# Patient Record
Sex: Male | Born: 1985 | Race: White | Hispanic: No | Marital: Single | Smoking: Current every day smoker
Health system: Southern US, Community
[De-identification: ages and names within clinical notes are randomized; demographics above are authoritative.]

## PROBLEM LIST (undated history)

## (undated) DIAGNOSIS — I6330 Cerebral infarction due to thrombosis of unspecified cerebral artery: Secondary | ICD-10-CM

## (undated) DIAGNOSIS — R51 Headache: Secondary | ICD-10-CM

## (undated) DIAGNOSIS — S069XAA Unspecified intracranial injury with loss of consciousness status unknown, initial encounter: Secondary | ICD-10-CM

## (undated) DIAGNOSIS — I639 Cerebral infarction, unspecified: Secondary | ICD-10-CM

## (undated) DIAGNOSIS — S069X9A Unspecified intracranial injury with loss of consciousness of unspecified duration, initial encounter: Secondary | ICD-10-CM

## (undated) DIAGNOSIS — G8110 Spastic hemiplegia affecting unspecified side: Secondary | ICD-10-CM

## (undated) HISTORY — DX: Unspecified intracranial injury with loss of consciousness status unknown, initial encounter: S06.9XAA

## (undated) HISTORY — PX: ANKLE SURGERY: SHX546

## (undated) HISTORY — PX: EYE SURGERY: SHX253

## (undated) HISTORY — PX: TONSILECTOMY, ADENOIDECTOMY, BILATERAL MYRINGOTOMY AND TUBES: SHX2538

## (undated) HISTORY — DX: Headache: R51

## (undated) HISTORY — DX: Cerebral infarction, unspecified: I63.9

## (undated) HISTORY — DX: Spastic hemiplegia affecting unspecified side: G81.10

## (undated) HISTORY — DX: Unspecified intracranial injury with loss of consciousness of unspecified duration, initial encounter: S06.9X9A

## (undated) HISTORY — DX: Cerebral infarction due to thrombosis of unspecified cerebral artery: I63.30

---

## 2010-10-01 ENCOUNTER — Inpatient Hospital Stay (HOSPITAL_COMMUNITY)
Admission: RE | Admit: 2010-10-01 | Discharge: 2010-10-19 | DRG: 945 | Disposition: A | Discharge status: Home Health | Payer: Medicaid Other | Source: Other Acute Inpatient Hospital | Attending: Physical Medicine & Rehabilitation | Admitting: Physical Medicine & Rehabilitation

## 2010-10-01 DIAGNOSIS — Z5189 Encounter for other specified aftercare: Principal | ICD-10-CM

## 2010-10-01 DIAGNOSIS — R51 Headache: Secondary | ICD-10-CM

## 2010-10-01 DIAGNOSIS — I6330 Cerebral infarction due to thrombosis of unspecified cerebral artery: Secondary | ICD-10-CM

## 2010-10-01 DIAGNOSIS — G009 Bacterial meningitis, unspecified: Secondary | ICD-10-CM | POA: Diagnosis present

## 2010-10-01 DIAGNOSIS — F952 Tourette's disorder: Secondary | ICD-10-CM

## 2010-10-01 DIAGNOSIS — J189 Pneumonia, unspecified organism: Secondary | ICD-10-CM | POA: Diagnosis present

## 2010-10-01 DIAGNOSIS — G8190 Hemiplegia, unspecified affecting unspecified side: Secondary | ICD-10-CM | POA: Diagnosis present

## 2010-10-01 DIAGNOSIS — R4701 Aphasia: Secondary | ICD-10-CM | POA: Diagnosis present

## 2010-10-01 DIAGNOSIS — F909 Attention-deficit hyperactivity disorder, unspecified type: Secondary | ICD-10-CM | POA: Diagnosis present

## 2010-10-01 DIAGNOSIS — F17200 Nicotine dependence, unspecified, uncomplicated: Secondary | ICD-10-CM | POA: Diagnosis present

## 2010-10-01 DIAGNOSIS — I6340 Cerebral infarction due to embolism of unspecified cerebral artery: Secondary | ICD-10-CM | POA: Diagnosis present

## 2010-10-02 DIAGNOSIS — I6330 Cerebral infarction due to thrombosis of unspecified cerebral artery: Secondary | ICD-10-CM

## 2010-10-02 DIAGNOSIS — G009 Bacterial meningitis, unspecified: Secondary | ICD-10-CM

## 2010-10-02 DIAGNOSIS — F952 Tourette's disorder: Secondary | ICD-10-CM

## 2010-10-02 DIAGNOSIS — R51 Headache: Secondary | ICD-10-CM

## 2010-10-03 LAB — BASIC METABOLIC PANEL
CO2: 31 mEq/L (ref 19–32)
Calcium: 9.8 mg/dL (ref 8.4–10.5)
Creatinine, Ser: 0.76 mg/dL (ref 0.4–1.5)
GFR calc Af Amer: >60 mL/min (ref >60)
GFR calc non Af Amer: >60 mL/min (ref >60)
Glucose, Bld: 72 mg/dL (ref 70–99)
Sodium: 137 mEq/L (ref 135–145)

## 2010-10-04 ENCOUNTER — Inpatient Hospital Stay (HOSPITAL_COMMUNITY): Payer: Medicaid Other

## 2010-10-04 DIAGNOSIS — G009 Bacterial meningitis, unspecified: Secondary | ICD-10-CM

## 2010-10-04 DIAGNOSIS — F952 Tourette's disorder: Secondary | ICD-10-CM

## 2010-10-04 DIAGNOSIS — R51 Headache: Secondary | ICD-10-CM

## 2010-10-04 DIAGNOSIS — I6330 Cerebral infarction due to thrombosis of unspecified cerebral artery: Secondary | ICD-10-CM

## 2010-10-04 LAB — COMPREHENSIVE METABOLIC PANEL
ALT: 33 U/L (ref 0–53)
AST: 22 U/L (ref 0–37)
Albumin: 3.3 g/dL — ABNORMAL LOW (ref 3.5–5.2)
Calcium: 9.7 mg/dL (ref 8.4–10.5)
Creatinine, Ser: 0.78 mg/dL (ref 0.4–1.5)
GFR calc Af Amer: >60 mL/min (ref >60)
GFR calc non Af Amer: >60 mL/min (ref >60)
Sodium: 137 mEq/L (ref 135–145)
Total Protein: 7.3 g/dL (ref 6.0–8.3)

## 2010-10-04 LAB — CBC
MCH: 32 pg (ref 26.0–34.0)
MCHC: 33.4 g/dL (ref 30.0–36.0)
Platelets: 561 10*3/uL — ABNORMAL HIGH (ref 150–400)
RDW: 13.4 % (ref 11.5–15.5)

## 2010-10-04 LAB — DIFFERENTIAL
Basophils Absolute: 0.2 10*3/uL — ABNORMAL HIGH (ref 0.0–0.1)
Basophils Relative: 3 % — ABNORMAL HIGH (ref 0–1)
Eosinophils Absolute: 0.4 10*3/uL (ref 0.0–0.7)
Monocytes Absolute: 0.7 10*3/uL (ref 0.1–1.0)
Monocytes Relative: 14 % — ABNORMAL HIGH (ref 3–12)
Neutro Abs: 2 10*3/uL (ref 1.7–7.7)
Neutrophils Relative %: 43 % (ref 43–77)

## 2010-10-04 MED ORDER — IOHEXOL 350 MG/ML SOLN
50.0000 mL | Freq: Once | INTRAVENOUS | Status: AC | PRN
  Administered 2010-10-04: 50 mL via INTRAVENOUS

## 2010-10-05 LAB — LIPID PANEL
Cholesterol: 193 mg/dL (ref 0–200)
HDL: 57 mg/dL (ref >39)
LDL Cholesterol: 119 mg/dL — ABNORMAL HIGH (ref 0–99)
Triglycerides: 84 mg/dL (ref <150)

## 2010-10-05 LAB — PROTEIN S, TOTAL: Protein S Ag, Total: 130 % (ref 60–150)

## 2010-10-05 LAB — LUPUS ANTICOAGULANT PANEL
DRVVT: 42.3 secs (ref 36.2–44.3)
PTT Lupus Anticoagulant: 35.1 secs (ref 30.0–45.6)

## 2010-10-05 LAB — PROTEIN C ACTIVITY: Protein C Activity: 152 % — ABNORMAL HIGH (ref 75–133)

## 2010-10-05 LAB — HEMOGLOBIN A1C: Mean Plasma Glucose: 111 mg/dL (ref <117)

## 2010-10-06 ENCOUNTER — Ambulatory Visit (HOSPITAL_COMMUNITY)
Admission: RE | Admit: 2010-10-06 | Discharge: 2010-10-06 | Disposition: A | Discharge status: Home / Self Care | Payer: Medicaid Other | Source: Ambulatory Visit | Attending: Cardiology | Admitting: Cardiology

## 2010-10-06 DIAGNOSIS — Z0389 Encounter for observation for other suspected diseases and conditions ruled out: Secondary | ICD-10-CM | POA: Insufficient documentation

## 2010-10-06 DIAGNOSIS — Z5309 Procedure and treatment not carried out because of other contraindication: Secondary | ICD-10-CM | POA: Insufficient documentation

## 2010-10-06 LAB — CARDIOLIPIN ANTIBODIES, IGG, IGM, IGA
Anticardiolipin IgG: 5 GPL U/mL — ABNORMAL LOW (ref <23)
Anticardiolipin IgM: 5 MPL U/mL — ABNORMAL LOW (ref <11)

## 2010-10-06 LAB — BETA-2-GLYCOPROTEIN I ABS, IGG/M/A
Beta-2-Glycoprotein I IgA: 7 A Units (ref <20)
Beta-2-Glycoprotein I IgM: 5 M Units (ref <20)

## 2010-10-06 NOTE — Consult Note (Signed)
[name], [name] NO.:  [id number]  MEDICAL RECORD NO.:  [id number]           PATIENT TYPE:  I  LOCATION:  4025                         FACILITY:  [facility]  PHYSICIAN:  Mike Heritage, MD       DATE OF BIRTH:  Apr 19, 1986  DATE OF CONSULTATION:  10/04/2010 DATE OF DISCHARGE:                                CONSULTATION   REASON FOR CONSULTATION:  Subacute left basal ganglia stroke.  HISTORY OF PRESENT ILLNESS:  This is a 25 year old male with past medical history of headaches, cervicalgia, muscle spasms, pneumonia, Tourette syndrome, meningitis, new left basal ganglia stroke and ADHD. The patient was recently seen in [facility] for headache for 5 days and mental status change.  At that time, he was diagnosed with bacterial meningitis.  He was started on Rocephin for a course of 14 days.  During hospitalization at [facility], the patient was noted to have right-sided weakness.  On Sep 19, 2010, the patient had a CT of brain which showed a subacute right internal capsule basal ganglia stroke.  The patient was started on aspirin at that time.  The patient was recently transferred to rehab on Oct 01, 2010, for further evaluation and management of right-sided weakness.  After arriving in rehab, it was noted that the patient did not get full stroke workup and Neuro was consulted to evaluate the patient and make recommendations. At the present time, the patient is alert and oriented.  He shows expressive aphasia as far as difficulty word-finding and/or substitution.  In addition, he also shows significant right upper and lower extremity flaccidity.  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  MEDICATIONS:  Aspirin, low-molecular-weight heparin, Lexapro, Duragesic patch, Neurontin, oxycodone, vancomycin,  Tylenol.  ALLERGIES:  AMOXICILLIN.  SOCIAL HISTORY:  The patient smokes approximately a pack a day.  Does not drink, does not do illicit drugs.  Lives with his parents at home.  REVIEW OF SYSTEMS:  Negative with exception above.  PHYSICAL EXAMINATION:  GENERAL:  The patient is alert and oriented. Carries out 2-3 step commands. NEUROLOGIC:  Pupils equal, round, reactive.  Conjugate gaze. Extraocular movements intact.  Visual fields are grossly intact.  Face shows a positive right facial droop.  Tongue is midline.  Uvula is midline.  The patient shows dysarthria along with expressive aphasia. Facial sensation is intact to pinprick, light touch.  The patient has no shoulder shrug on the right and head turn is decreased to the left. Coordination:  Finger-to-nose is smooth on the left, unable to do on the right.  Heel-to-shin is smooth on the left, unable to do on the right. Fine motor movements are within normal limits on his left, unable to move his hand on the right.  The patient's motor shows 5/5 strength in left upper and left lower extremity.  He shows 0/5 strength on the right upper extremity with inability to shrug his shoulder on the right.  He shows no grip on the right.  He shows a minimal trace contraction of  his right quad when trying to adduct or abduct his leg.  Otherwise, he shows 0/5 strength on his right quad, hamstrings, calf, and foot.  The patient shows 2/4 reflexes on his left biceps, triceps, left patella, left Achilles.  He has downgoing toes on the left.  He shows 3/4 deep tendon reflexes on his right biceps and triceps with a 4/4 with clonus right patella deep tendon reflex.  He shows 16+ beats of clonus on his right Achilles deep tendon reflex and upgoing toe on the right.  The patient shows positive drift on his right upper and lower extremity as he is unable to withstand gravity.  No drift on his left upper and lower extremity.  The patient states that his  sensation is intact to pinprick, light touch and vibration throughout and there is no difference between the right and left. PULMONARY:  Clear to auscultation. CARDIOVASCULAR:  S1-S2 is audible. NECK:  Negative bruits.  LABORATORY DATA:  His sodium is 137, potassium 4.2, chloride 100, CO2 is 30, BUN 16, creatinine 0.78, glucose 90.  White blood cell count is 4.8, platelets 561, hemoglobin 12.4, hematocrit 37.1.  CT of head shows a new acute subacute well-defined left basal ganglia internal capsule infarct.  ASSESSMENT:  This is a pleasant 25 year old Caucasian male with a left subacute well-defined basal ganglia internal capsule stroke of unknown etiology at this time.  We cannot rule out bacterial emboli secondary to sepsis nor can rule out small vessel disease or hypercoagulable state given the patient's age.  Our recommendations at this time are: 1. TEE to further evaluate for a possible cardiac     embolic source. 2. Hypercoagulable panel to look for hypercoagulable state possibility 3. CT angio of head and neck. 4. Continue aspirin. 5. Smoking cessation. 6. Fasting lipid HbA1c.  Dr. Hoy Carrillo agrees with the above-mentioned and has seen and evaluated the patient.  Stroke team will follow in the morning.     Mike Morn, PA-C  I have examined the patient and agree with above clinical findings. ______________________________ Mike Heritage, MD    DS/MEDQ  D:  10/04/2010  T:  10/04/2010  Job:  [id number]  Electronically Signed by Mike Morn PA-C on 10/06/2010 01:01:42 PM Electronically Signed by Mike Heritage MD on 10/06/2010 03:20:34 PM

## 2010-10-07 ENCOUNTER — Inpatient Hospital Stay (HOSPITAL_COMMUNITY): Payer: Medicaid Other

## 2010-10-07 LAB — FACTOR 5 LEIDEN

## 2010-10-11 DIAGNOSIS — F952 Tourette's disorder: Secondary | ICD-10-CM

## 2010-10-11 DIAGNOSIS — G009 Bacterial meningitis, unspecified: Secondary | ICD-10-CM

## 2010-10-11 DIAGNOSIS — I6330 Cerebral infarction due to thrombosis of unspecified cerebral artery: Secondary | ICD-10-CM

## 2010-10-11 LAB — CULTURE, BLOOD (ROUTINE X 2)
Culture  Setup Time: 201205222337
Culture: NO GROWTH

## 2010-10-13 DIAGNOSIS — I619 Nontraumatic intracerebral hemorrhage, unspecified: Secondary | ICD-10-CM

## 2010-10-13 DIAGNOSIS — F0789 Other personality and behavioral disorders due to known physiological condition: Secondary | ICD-10-CM

## 2010-10-15 DIAGNOSIS — I6330 Cerebral infarction due to thrombosis of unspecified cerebral artery: Secondary | ICD-10-CM

## 2010-10-15 DIAGNOSIS — R51 Headache: Secondary | ICD-10-CM

## 2010-10-15 DIAGNOSIS — F952 Tourette's disorder: Secondary | ICD-10-CM

## 2010-10-15 DIAGNOSIS — G009 Bacterial meningitis, unspecified: Secondary | ICD-10-CM

## 2010-10-16 DIAGNOSIS — I6330 Cerebral infarction due to thrombosis of unspecified cerebral artery: Secondary | ICD-10-CM

## 2010-10-16 DIAGNOSIS — F952 Tourette's disorder: Secondary | ICD-10-CM

## 2010-10-16 DIAGNOSIS — R51 Headache: Secondary | ICD-10-CM

## 2010-10-16 DIAGNOSIS — G009 Bacterial meningitis, unspecified: Secondary | ICD-10-CM

## 2010-11-08 NOTE — H&P (Signed)
[name], [name] NO.:  [id number]  MEDICAL RECORD NO.:  [id number]           PATIENT TYPE:  I  LOCATION:  4025                         FACILITY:  [facility]  PHYSICIAN:  Ranelle Oyster, M.D.DATE OF BIRTH:  09-18-1985  DATE OF ADMISSION:  10/01/2010 DATE OF DISCHARGE:                             HISTORY & PHYSICAL   CHIEF COMPLAINT:  Right-sided weakness, headache, cervicalgia.  The patient has no PCP.  HISTORY OF PRESENT ILLNESS:  This is a 25 year old white male with Tourette syndrome, admitted to [facility] with headaches for 5 days and mental status changes.  He was started on IV vancomycin, Zosyn, acyclovir for meningitis.  LP done showing evidence of bacterial meningitis and IV Rocephin was recommended for 2 weeks by ID.  He still has pneumonia with blood cultures positive for MRSA.  Levaquin and vancomycin were recommended through May 19.  On May 6, the patient developed right-sided weakness and CT was done showing a new acute/subacute infarct in the left basal ganglia internal capsule. Aspirin was added for stroke prophylaxis per neuro input.  I felt this was likely a septic infarct.  The patient with ongoing right lower extremity pain.  Dopplers were negative.  Knee x-rays were negative. The patient had difficulties with impulsivity and problems with awareness.  The rehab team evaluate the case and felt he could benefit from an inpatient rehab stay.  REVIEW OF SYSTEMS:  Notable for cervicalgia, headache, muscle spasm. Full 12-point reviews in the written H and P.  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  FAMILY HISTORY:  Positive for diabetes and depression.  SOCIAL HISTORY:  The patient is single.  Worked odd jobs.  Lives with his parents, one-level house with ramp to enter.  He smoked one-pack per day and drinks.  He has a very supportive family who will help him  at discharge.  ALLERGIES:  AMOXIL.  HOME MEDICATIONS:  None.  LABS:  Hemoglobin 10.8, white count 9.2, platelets 579.  Sodium 139, potassium 4.9, BUN 8, creatinine 0.67.  PHYSICAL EXAM:  GENERAL:  The patient is lying in bed, pleasant, with mild discomfort.  He complained of headache and left neck pain. HEENT:  Right pupil larger than the left.  Conjunctivae was normal. Ears and throat exam notable for intact dentition.  He had some pain in the right jaw with mouth opening. NECK:  Notable for pain along left sternocleidomastoid.  Pain was worse with neck rotation to the right and bending to the left. CHEST:  Clear to auscultation bilaterally without wheezes, rales, or rhonchi. HEART:  Regular rate and rhythm without murmurs, rubs, or gallops. ABDOMEN:  Soft, nontender.  Bowel sounds are positive. SKIN:  Generally intact without any signs of breakdown. NEUROLOGIC:  Cranial nerves II-XII notable for right facial weakness, right tongue deviation.  Speech is slightly dysarthric.  He denies sensation differences from right to left.  Reflexes are hyperactive, 3+, with sustained clonus at the right ankle noted.  Judgment was fair, although, he had poor insight awareness.  Some language is confused at times.  Memory was fair.  Mood was flat at best.  Right upper extremity showed trace movement of the shoulder and biceps, some of it was reflexive.  He had 0/5 strength in the right lower extremity.  POST ADMISSION PHYSICIAN EVALUATION: 1. Functional deficit secondary to bacterial meningitis with potentially embolic     left basal ganglia/internal capsular infarct, subsequent right     hemiparesis, aphasia, and dysphasia. 2. The patient is admitted to receive collaborative interdisciplinary     care between the physiatrist, rehab nursing staff, and therapy     team. 3. The patient's level of medical complexity which includes his     meningitis, pneumonia, and pain management, as well as  substantial     therapy needs in context of that medical necessity cannot be     provided at a lesser intensity of care. 4. The patient has experienced substantial functional loss from his     baseline.  Premorbidly, he had been independent.  Currently, he is     contact guard assist for transfers with ability to weightbear to     the right leg, needing substantial verbal cues for safety.  Right     knee had to be guard for stability.  He has mod assist dawn shoes.     He is working on core strength and balance, is reaching activities.     Judging by the patient's diagnosis, physical exam, and functional     history, he has potential for functional progress which result in     measurable gains while inpatient rehab.  His gains will be of     substantial and practical use upon discharge to home in     facilitating, mobility, and self-care. 5. The physiatrist provide 24-hour management of medical needs as well     as oversight of the therapy plans/treatment and provide guidance as     appropriate regarding the interaction of the two.  Medical problem     list and plan are below. 6. A 24-hour rehab nursing team will assist in manage the patient's     skin care needs as well as bowel and bladder function, safety     awareness, integration of therapy, concept techniques, nutrition,     etc. 7. PT will assess and treat for lower extremity strength, functional     mobility, safety, gait, adaptive techniques, neuromuscular     education, spasticity management with goals, modified independent     supervision perhaps occasional min assist. 8. OT assess and treat for upper extremity use, ADLs, adaptive     techniques, equipment, functional mobility, gait.  Patient     education, the patient's family safety education with goals overall     from modified independent to min assist. 9. Speech language pathology will assess and treat for communication,     swallowing, and cognitive needs with goals,  ranging from modified     independent to min assist. 10.Case management with social worker will assess treat for     psychosocial issues and discharge planning. 11.Team conference will be held weekly to assess progress towards     goals and to determine barriers at discharge. 12.The patient has demonstrated sufficient medical stability and     exercise capacity to tolerate at least 3 hours of therapy per day     at least 5 days per week. 13.Estimated length of stay is approximately 3+ weeks.  Prognosis is     good.  MEDICAL PROBLEM LIST AND PLAN: 1. DVT prophylaxis with  subcu Lovenox.  We will follow for any signs     and symptoms of bleeding, complications.  Check platelets etc.     Stroke prophylaxis currently with aspirin per Neurology input.  I     think we need to further workup the stroke starting with an MRI/MRA on     admission.  I think the patient also needs echocardiogram     considering his bacteremia and the potential for an endocarditis. 2. Pain management, p.r.n. oxycodone for now.  We will observe     tolerance with activities and we want to ensure he is comfortable     and is able to sleep at night. 3. Mood:  Lexapro has been added to help boost mood.  Team will     provide ego support and further address as needed.  Consider     neuropsychology evaluation also. 4. Pneumonia:  Levaquin completes today.  Follow-up chest x-ray in the     morning.  The patient denies any coughing or any respiratory     symptoms on examination today. 5. MRSA bacteremia:  IV vancomycin times 21 days.  Follow renal status     levels as well as any other side effects including diarrhea. 6. Tourette syndrome:  We will observe for behavior and interaction     with staff.  Follow for attention.  The patient does show some     apraxia on examination. 7. Headache:  Continue to schedule ibuprofen, oxycodone.  Add heating     pads to left neck region as well to treat sternocleidomastoid  pain.     Ranelle Oyster, M.D.     ZTS/MEDQ  D:  10/01/2010  T:  10/02/2010  Job:  [id number]  Electronically Signed by Faith Rogue M.D. on 11/08/2010 11:41:04 AM

## 2010-11-09 NOTE — Discharge Summary (Signed)
Mike, Carrillo NO.:  [id number]  MEDICAL RECORD NO.:  [id number]  LOCATION:  4025                         FACILITY:  [facility]  PHYSICIAN:  [name], M.D.DATE OF BIRTH:  June 14, 1985  DATE OF ADMISSION:  10/01/2010 DATE OF DISCHARGE:  10/19/2010                              DISCHARGE SUMMARY   DISCHARGE DIAGNOSES: 1. 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 2. Headaches much improved. 3. Spasticity. 4. History of Tourette syndrome. 5. Methicillin-resistant Staphylococcus aureus bacteremia treated.  HISTORY OF PRESENT ILLNESS:  Mr. [name] is a 25 year old male with history of Tourette syndrome admitted to [facility] Sep 17, 2010, with headaches times 5 days and decreased level of consciousness and mental status changes.  The patient was started on IV vancomycin, Zosyn and acyclovir for meningitis.  LP done showed evidence of bacterial meningitis.  Recommendations are to treat with IV Rocephin x2 weeks per Infectious Disease.  The patient also developed pneumonia as well as bacteremia with blood cultures positive for MRSA.  He is being treated with Levaquin and vancomycin for this.  On Sep 19, 2010, the patient developed right-sided weakness.  CT of the head done showed new acute subacute left basal ganglia internal capsule infarct.  Aspirin was added for CVA prophylaxis per neuro input.  They felt this was likely septic infarct.  The patient has had issues with right lower extremity pain and right lower extremity Dopplers done have been negative for DVT.  X-rays of right knee have shown no abnormality.  Therapies were initiated and the patient is noted to have impulsivity with decreased awareness of deficits and word-finding deficits.  He requires encouragement to sit out of bed.  He is noted to have increased tone in right lower extremity.  PAST MEDICAL HISTORY:  Significant for: 1. 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 2. ADHD. 3. Tourette syndrome. 4. Asthma.  ALLERGIES:  AMOXICILLIN.  REVIEW OF SYMPTOMS:  Positive for weakness right side as well as headaches.  FAMILY HISTORY:  Positive for diabetes mellitus and depression.  SOCIAL HISTORY:  The patient is single and used to work odd jobs.  Lives with parents in 1-level home with ramp at entry.  Has a history of one- pack per day tobacco use.  Positive alcohol use, supportive family, is to provide assistance past discharge.  FUNCTIONAL HISTORY:  The patient was independent prior to admission.  FUNCTIONAL STATUS:  The patient is contact guard assist transfers with increased ability to weightbear through right lower extremity.  He is requiring verbal cues for safety and right knee block for mobility.  He is mod assist to don shoes with aid with OT.  He is working on core strength, balance and reaching activities at the edge of bed.  PHYSICAL EXAMINATION:  GENERAL:  The patient is thin adult male, pleasant, in mild discomfort due to headache. HEENT:  The patient with anisocoria right pupil larger than left. Conjunctiva normal.  Ears, throat exam notable for intact dentition. The patient has some pain in the right jaw with mouth opening. NECK:  Notable for pain along the left sternocleidomastoid pain worse with neck rotation to right and bending to  the left. LUNGS:  Clear to auscultation bilaterally without wheezes, rales or rhonchi. HEART:  Regular rate and rhythm without murmurs or gallops or rubs. ABDOMEN:  Soft, nontender with positive bowel sounds. SKIN:  Intact without any signs of breakdown. NEURO:  Cranial nerves II-XII notable for right facial weakness with right tongue deviation.  Speech is slightly dysarthric but confused language.  The patient denied sensation right to left.  Reflexes are hyperactive 3+ with sustained clonus on right ankle.  Judgment is fair although he has poor insight and poor awareness.  Memory is fair.   Mood is flat in bed.  Right upper extremity shows trace movement at shoulder and biceps, some of it is reflexive.  He has 0/5 strength in right lower extremity.  HOSPITAL COURSE:  Mr. [name] was admitted to rehab on Oct 01, 2010, for inpatient therapies to consist of PT, OT and speech therapy at least 3-hour 5 days a week.  Past admission physiatrist, rehab RN, and therapy team have worked together to provide customized collaborative interdisciplinary care.  The patient was maintained on IV Rocephin through Oct 02, 2010, and IV vanc through Oct 07, 2010, to complete his antibiotic course.  He was started on pain management with use of oxycodone initially on p.r.n. basis as well as heating pad to left neck to help with headaches.  As the patient continued to have issues with sustained headaches, Neurontin was added and slowly titrated upwards. Fentanyl patch was also initiated to help with pain management issues. The patient continued to be distracted due to significant headache and Topamax was added and titrated to 75 mg p.o. b.i.d..  He is noted to have some sedation at the time of discharge, therefore, Neurontin decreased to 300 mg p.o. t.i.d.  Family was educated regarding monitoring for sedation and if pt continues to be sleepy;  they are to further decrease Neurontin to b.i.d. basis.  Overall, headaches have greatly improved.  He was noted to have increased spasticity on right lower greater than right upper extremity and baclofen was added to help with this symptomatology.  Speech Therapy has been following for diet tolerance and the patient was advanced to regular diet and his appetite is very good.  Neurology was consulted for input on the patient for full workup of stroke.  They recommended a TEE for full workup.  Also, CT head and neck were ordered as the patient unable to have MRI due to metal plate.  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  TEE was attempted by Dr. Gala Romney.  As he had difficulty passing probe and it was again  attempted with sedation in OR.  He  continued to have difficulty passing the scope and he recommended GI workup for questionable esophageal abnormality such as webbing or stenosis.  Dr. Arlyce Dice was contacted for input.  However, Neurology felt that pt's large MCA stroke, likely due to terminal LCA stenosis and spasms from bacterial meningitis.  He recommended adding statin and folic acid.  He also recommended outpatient followup TCD and CT angio in a few weeks.  C-spine x-rays were done and these have shown no stenosis or spurring.  The patient's blood pressures have been checked on b.i.d.  basis during this stay. These have ranged from 90s to 120s systolic, 50s 60s diastolic.  Heart rate stable in 70s range.  Labs done past admission include hemoglobin A1c which is normal at 5.5.  Lipid profile revealed cholesterol 193, triglycerides 84, LDL 119, HDL 57.  Hypocoagulable panel showed high homocystine levels at 16.4, and negative for factor V mutation, negative for lupus anticoagulant.  Check of lytes past admission revealed sodium 137, potassium 4.2, chloride 100, CO2 30, BUN 6, creatinine 0.78, glucose 90.  LFTs normal revealing total bili at 0.4, alkaline phos 72, SGOT 22, SGPT 33, total protein 7.3, albumin 3.3.  CBC done revealed hemoglobin 12.4, hematocrit 37.1, white count 4.8, platelets 561.  Blood cultures x2 were repeated on Oct 05, 2010, and this have shown no growth.  Rehab RN has been working with the patient on bowel and bladder program and the patient has been  continent with scheduled toileting.  During the patient's stay in rehab, weekly team conferences were held to monitor the patient's progress, set goals and discuss barriers to discharge.  At the time of admission, the patient was noted to have cognitive linguistic deficits with decreased problem solving, decreased awareness, poor recall, question language of confusion versus aphasic semantic type errors, dysarthria with articulation errors and dysphagia.  The patient's progress has been limited in part due to his poor participation.  He is currently max total assist for insight and awareness and regarding his physical and cognitive deficits which in turn have handed his progress.  He is currently mod assist for verbal expression and comprehension with mild verbal and questioning cues to indicate word finding and self monitor and correct for errors.  He is currently at max assist for recall, problem solving, attention and awareness.  Family education was done with parents and patient is to continue receiving further followup home health speech therapy past discharge.  Occupational Therapy has worked with the patient on self- care tasks with focus on right-sided movement.  The patient was impaired by his cognitive deficits as well as decreased balance, decreased sensation and his right hemiparesis.  The patient has made slow progress and is currently at close supervision for bed or wheelchair to toilet transfers with assistance for body positioning and min assist for dynamic balance.  He requires assistance to promote weight bearing through right lower extremity.  He is min assist for bathing, supervision for dressing.  Family education has been done with mother and father who understand the amount of assistance that is currently needed. 24-hour supervision assistance is recommended to maintain safety with self-care tasks.  Physical Therapy has been working with the patient on mobility.   At admission, the patient was limited by right hemiparesis, decreased endurance as well as headaches.  He was at +2 total assist, patient 25% with assist for right lower extremity stance and swing for 50 feet.  He was noted to have clonus right lower extremity which interfered his mobility as well as issues with decreased postural control.  Currently, the patient is at supervision level for bed mobility and transfers.  He requires min assist for dynamic standing balance with upper extremity support, +2 total assist for mobility with, the patient had 45% for ambulation of short distances.  Family education has been done with demonstration of return of assistance needed for transfers, wheelchair navigation on curb, car transfers as well as home exercise plan for right upper and right lower extremity strengthening. The patient to continue receiving further follow-up home  health PT, OT, ST by [facility] past discharge.  On October 19, 2010, the patient was discharged to home.  DISCHARGE MEDICATIONS: 1. Tylenol 325-650 mg p.o. q.4 h. p.r.n. pain. 2. Aspirin 325 mg p.o. per day. 3. Baclofen 10 mg p.o. q.i.d. 4. Celexa 40 mg p.o. per day. 5. Fentanyl patch 25 mcg an hour, change q. 72 hours, #2 boxes Rx'ed,     next change on October 22, 2010. 6. Neurontin 300 mg p.o. t.i.d.  Monitor for sleepiness and decrease     to b.i.d. if sleepiness persists. 7. OxyIR 5 mg 1-2 p.o. q.6 h. p.r.n. moderate-to-severe pain #90    Rx'ed. 8. Pravastatin 40 mg p.o. at bedtime. 9. Topamax 25 mg 3 pills p.o. b.i.d.  DIET:  Regular.  ACTIVITY LEVEL:  24-hour supervision and assistance.  No strenuous activity.  SPECIAL INSTRUCTIONS:  No alcohol, no smoking, no driving.  [facility] to provide PT, OT, speech therapy and RN.  FOLLOWUP:  The patient to follow up with Dr. Riley Kill November 24, 2010, at 10:30 a.m.  Follow up with Dr. Pearlean Brownie in 4-6 weeks for a TCD and input regarding further  workup.     Delle Reining, P.A.   ______________________________ [name], M.D.    PL/MEDQ  D:  10/20/2010  T:  10/21/2010  Job:  [id number]  cc:   Pramod P. Pearlean Brownie, MD Bevelyn Buckles. Bensimhon, MD  Electronically Signed by Osvaldo Shipper. on 10/21/2010 03:41:36 PM Electronically Signed by Faith Rogue M.D. on 11/09/2010 09:17:57 AM

## 2010-11-24 ENCOUNTER — Encounter: Payer: Medicaid Other | Attending: Physical Medicine & Rehabilitation | Admitting: Physical Medicine & Rehabilitation

## 2010-11-24 DIAGNOSIS — R51 Headache: Secondary | ICD-10-CM | POA: Insufficient documentation

## 2010-11-24 DIAGNOSIS — G8110 Spastic hemiplegia affecting unspecified side: Secondary | ICD-10-CM | POA: Insufficient documentation

## 2010-11-24 DIAGNOSIS — I6330 Cerebral infarction due to thrombosis of unspecified cerebral artery: Secondary | ICD-10-CM

## 2010-11-24 DIAGNOSIS — S069X9A Unspecified intracranial injury with loss of consciousness of unspecified duration, initial encounter: Secondary | ICD-10-CM

## 2010-11-24 DIAGNOSIS — F952 Tourette's disorder: Secondary | ICD-10-CM | POA: Insufficient documentation

## 2010-11-24 DIAGNOSIS — Z8673 Personal history of transient ischemic attack (TIA), and cerebral infarction without residual deficits: Secondary | ICD-10-CM | POA: Insufficient documentation
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
## 2010-12-29 ENCOUNTER — Encounter: Payer: Self-pay | Admitting: Physical Medicine & Rehabilitation

## 2011-02-02 ENCOUNTER — Encounter: Payer: Medicaid Other | Attending: Physical Medicine & Rehabilitation | Admitting: Physical Medicine & Rehabilitation

## 2011-02-02 DIAGNOSIS — R51 Headache: Secondary | ICD-10-CM

## 2011-02-02 DIAGNOSIS — S069X9A Unspecified intracranial injury with loss of consciousness of unspecified duration, initial encounter: Secondary | ICD-10-CM

## 2011-02-02 DIAGNOSIS — G8110 Spastic hemiplegia affecting unspecified side: Secondary | ICD-10-CM

## 2011-02-02 DIAGNOSIS — I6330 Cerebral infarction due to thrombosis of unspecified cerebral artery: Secondary | ICD-10-CM

## 2011-02-02 DIAGNOSIS — I69959 Hemiplegia and hemiparesis following unspecified cerebrovascular disease affecting unspecified side: Secondary | ICD-10-CM | POA: Insufficient documentation

## 2011-02-02 DIAGNOSIS — Z8661 Personal history of infections of the central nervous system: Secondary | ICD-10-CM | POA: Insufficient documentation

## 2011-02-02 NOTE — Assessment & Plan Note (Signed)
[name], birth date, February 05, 1986, is back regarding his stroke and meningitis.  Pain is a bit better, he is still having symptoms in the right arm and leg.  Sleep is an issue.  Mother states he is sleeping all the time.  Speaking into the situation, [name] is not going to bed until 3 or 4 in the morning and then often sleeps still 3 the next day.  His family is trying to push him to get him up, but he is not receptive at times.  His medication regimen gets off as a result and everything becomes dysfunctional.  He reports pain in the right arm and sometimes in the leg as well.  Family reports he is engaging in some activities around the home, but still is poor safety awareness as a whole and poor awareness overall.  He also reports some trouble emptying his bladder.  REVIEW OF SYSTEMS:  Notable for the above.  Full 12-point review is in the written health and history section of the chart.  SOCIAL HISTORY:  As noted above.  Lives with his mom and dad.  PHYSICAL EXAMINATION:  VITAL SIGNS:  Blood pressure is 122/69, pulse 67, respiratory rate 16, he is satting 100% on room air. GENERAL:  The patient is generally alert, irritable at times.  Headache seems to be better. MUSCULOSKELETAL:  He has ongoing tone in the right upper extremity at 2/4 throughout.  He has 1 to 2 out of 4 tone in the right lower extremity, particularly the hamstrings which are more of 2 to 2+. Reflexes are hyperactive at 3+ to 4 out of 5.  He has 1/5 to trace tone in the right lower extremity, remains in the 1 to 2 out of 5 motor score on the right lower extremity. NEUROLOGIC:  He has right central VII tongue deviation.  Speech is more intelligible.  He remains immature at times.  He is impulsive at times. HEART:  Regular. CHEST:  Clear. ABDOMEN:  Soft, nontender.  ASSESSMENT: 1. History of meningitis and left basal ganglia infarcts. 2. Headaches. 3. Right spastic hemiparesis. 4. History of Tourette  syndrome.  PLAN: 1. I told the patient that he needd to take some accountability here.     There is a longstanding pattern of impulsivity and poor decision     making long before I ever met this patient.  He needs to start     going to bed at a reasonable hour and taking his meds later.  I     discussed with his mother about moving his medications back to 10     p.m. at night including his Neurontin evening dose as well as     nortriptyline, Topamax, Celexa, etc.  He also needs be up out of     the bed by 11 o'clock at the latest. 2. I did refill his fentanyl patch 25 mcg #10.  Oxycodone did not need     to be refilled today. 3. We will increase his nortriptyline from 50 mg to 100 mg at bedtime     to help with sleep and pain at nighttime. 4. We will set up for Botox injections to the right biceps, FDP, FDS,     pectoralis major, minor, teres major and minor, perhaps hamstrings     as well depending on exam at followup. 5. We will keep baclofen as it is, I am not hesitant to add any     systemic spasticity meds due to his history  of sleepiness.     Ranelle Oyster, M.D. Electronically Signed    ZTS/MedQ D:  02/02/2011 13:46:43  T:  02/02/2011 21:12:03  Job #:  [id number]

## 2011-03-25 ENCOUNTER — Encounter: Payer: Medicaid Other | Attending: Physical Medicine & Rehabilitation | Admitting: Physical Medicine & Rehabilitation

## 2011-03-25 DIAGNOSIS — G8110 Spastic hemiplegia affecting unspecified side: Secondary | ICD-10-CM | POA: Insufficient documentation
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
## 2011-04-27 ENCOUNTER — Encounter: Payer: Medicaid Other | Attending: Physical Medicine & Rehabilitation | Admitting: Physical Medicine & Rehabilitation

## 2011-04-27 DIAGNOSIS — G8110 Spastic hemiplegia affecting unspecified side: Secondary | ICD-10-CM

## 2011-04-27 NOTE — Procedures (Signed)
NAMELOYD, [name]               ACCOUNT NO.:  [id number]  MEDICAL RECORD NO.:  [id number]           PATIENT TYPE:  O  LOCATION:  PRM                          FACILITY:  [facility]  PHYSICIAN:  Ranelle Oyster, M.D.DATE OF BIRTH:  May 16, 1986  DATE OF PROCEDURE: DATE OF DISCHARGE:                              OPERATIVE REPORT  PROCEDURE:  Phenol injection, diagnostic code 342.11, spastic hemiparesis dominant limb.  DESCRIPTION OF PROCEDURE:  After informed consent and preparation of skin with isopropyl alcohol, I utilize stimulator guidance to find the tibial nerve at the popliteal fossa.  Patient was laid on his left side with the affected leg, superior.  Titrating down from initially 15 mA to less than 1 mA, we localized the tibial nerve and adequate muscle response in the calf and tibial muscles.  Then after aspiration, we injected 5 mL of phenol and around the nerve.  The patient tolerated it well and experienced loosening of the associated musculature while in the office.  I also refilled patient's fentanyl patch which we will try to drop the 12 mcg in an attempt to eventually wean.  Refilled his oxycodone 5 mg, #90, 1 q.6 hours p.r.n.  We will send him to [facility] for left ankle foot orthosis as well as a resting right hand, restored doses.  The patient will see me back in about a month.  We will check urine drug screen at that point.     Ranelle Oyster, M.D. Electronically Signed    ZTS/MEDQ  D:  04/27/2011 11:38:37  T:  04/27/2011 20:39:01  Job:  [id number]  cc:   Renae Fickle Fax: [phone]

## 2011-05-31 ENCOUNTER — Encounter: Payer: Medicaid Other | Attending: Physical Medicine & Rehabilitation | Admitting: Physical Medicine & Rehabilitation

## 2011-05-31 DIAGNOSIS — S069X9A Unspecified intracranial injury with loss of consciousness of unspecified duration, initial encounter: Secondary | ICD-10-CM

## 2011-05-31 DIAGNOSIS — R51 Headache: Secondary | ICD-10-CM

## 2011-05-31 DIAGNOSIS — G8110 Spastic hemiplegia affecting unspecified side: Secondary | ICD-10-CM

## 2011-05-31 DIAGNOSIS — F952 Tourette's disorder: Secondary | ICD-10-CM | POA: Insufficient documentation

## 2011-05-31 DIAGNOSIS — Z8661 Personal history of infections of the central nervous system: Secondary | ICD-10-CM | POA: Insufficient documentation

## 2011-05-31 DIAGNOSIS — I69959 Hemiplegia and hemiparesis following unspecified cerebrovascular disease affecting unspecified side: Secondary | ICD-10-CM | POA: Insufficient documentation

## 2011-05-31 DIAGNOSIS — I6330 Cerebral infarction due to thrombosis of unspecified cerebral artery: Secondary | ICD-10-CM

## 2011-05-31 NOTE — Assessment & Plan Note (Signed)
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  We performed a phenol block to his right tibial nerve back about a month ago.  He states that the injection was helpful for a few weeks, but the spasticity is returning.  He feels the Botox lasted longer which we did in November.  His pain is about 8-10/10.  His family states he does still sit around at home a lot and sleep a lot.  He does have some depression.  Family is working on increasing activity and finding some other goals and objectives and hobbies for him.  REVIEW OF SYSTEMS:  Notable for the above.  He does report some weight gain.  Full 12-point review is in the written health and history section of the chart.  Instantly, he has not received his right ankle foot orthosis from [facility] yet.  SOCIAL HISTORY:  The patient is single, living with his family.  Brother and mother with him today.  PHYSICAL EXAMINATION:  VITAL SIGNS:  Blood pressure is 119/68, pulse is 80, respiratory rate is 16 and he is satting 98% on room air. GENERAL:  The patient is generally pleasant and alert.  He is quite cooperative actually today. EXTREMITIES:  Right upper extremity tone remains at 2+ to 3/4 at biceps and hand intrinsics as well as wrist flexors.  He has tightness in the right shoulder as well at 2/4.  Underlying strength is grossly 1-2/5. Right lower extremity strength is grossly 1+ to 2/5 as well with tightness noted still at the tibialis anterior, extensor hallucis longus, tibialis posterior and gastrocnemius muscles.  Sensation remains diminished on the right side to pinprick and light touch. SKIN:  A bit modeled right hand and right foot. HEART:  Regular. CHEST:  Clear. ABDOMEN:  Soft, nontender.  ASSESSMENT: 1. History of meningitis left basal ganglia infarcts. 2. Headaches. 3. Spastic right hemiparesis. 4. History of Tourette syndrome.  PLAN: 1. Continue working on stretching and range  of motion at home.  The     patient needs to be out of his bed as much as possible.  He needs     to find other goals of hobbies to occupy his time.  Increasing his     medication not the dose either here. 2. Refilled fentanyl patch 12 mcg 1 q.72 h. #10 and his oxycodone 5 mg     1 q.6 h. p.r.n. #90. 3. Set up for Botox injections 400 units to right tibialis anterior,     tibialis posterior, gastrocnemius and extensor hallucis longus. 4. I asked the patient to follow up with [facility] regarding bracing. 5. I will see him back pending scheduling of the above.     Ranelle Oyster, M.D. Electronically Signed    ZTS/MedQ D:  05/31/2011 12:58:47  T:  05/31/2011 45:40:98  Job #:  [id number]

## 2011-07-06 ENCOUNTER — Ambulatory Visit: Payer: Medicaid Other | Admitting: Physical Medicine & Rehabilitation

## 2011-07-06 ENCOUNTER — Telehealth: Payer: Self-pay | Admitting: Physical Medicine & Rehabilitation

## 2011-07-06 MED ORDER — OXYCODONE HCL 5 MG PO CAPS: 5.0000 mg | ORAL_CAPSULE | Freq: Four times a day (QID) | ORAL | Status: DC | PRN

## 2011-07-06 MED ORDER — FENTANYL 12 MCG/HR TD PT72: 1.0000 | MEDICATED_PATCH | TRANSDERMAL | Status: DC

## 2011-07-06 NOTE — Telephone Encounter (Signed)
If i hear that patient is continuing to drink and smoke, then all narcotics will be d'ced.

## 2011-07-06 NOTE — Telephone Encounter (Signed)
Mother requesting r/f on pain patches and oxycodone.  Topamax only has 1 refill.  Also states that  Osmin has started smoking, drinking and wanting to take only his pain pills.  Not eating the way he should because he thinks he is fat.  Does not know what to do.  Any suggestions?

## 2011-07-06 NOTE — Telephone Encounter (Signed)
Pt mother aware that rx are ready to pick up. She was told that if we are told anymore that Roey has been drinking or smoking we will no longer give him narcotics. She agrees with this.

## 2011-07-06 NOTE — Telephone Encounter (Signed)
Last seen on 05/31/11. Last fill on both meds was 05/31/11. Okay to order?  Any suggestions to give his mother about his smoking, drinking and not eating?  Thanks.

## 2011-07-27 ENCOUNTER — Other Ambulatory Visit: Payer: Self-pay | Admitting: *Deleted

## 2011-07-27 MED ORDER — BACLOFEN 20 MG PO TABS: 20.0000 mg | ORAL_TABLET | Freq: Four times a day (QID) | ORAL | Status: DC

## 2011-08-05 ENCOUNTER — Encounter: Payer: Medicaid Other | Admitting: Physical Medicine & Rehabilitation

## 2011-08-05 ENCOUNTER — Telehealth: Payer: Self-pay | Admitting: Physical Medicine & Rehabilitation

## 2011-08-05 NOTE — Telephone Encounter (Signed)
Patient needs refills on Fentanyl and Oxycodone.  [name] is sick today and mother cancelled Botox appointment for today.

## 2011-08-05 NOTE — Telephone Encounter (Signed)
Salar missed appt today because of illness. Requesting refills on Fentanyl 12 mcg 1 q72hr #10, and Oxycodone HCL IR 5 mg (capsule?) 1q6hr prn # 60,  last written 07/06/2011

## 2011-08-08 MED ORDER — OXYCODONE HCL 5 MG PO CAPS: 5.0000 mg | ORAL_CAPSULE | Freq: Four times a day (QID) | ORAL | Status: DC | PRN

## 2011-08-08 MED ORDER — FENTANYL 12 MCG/HR TD PT72: 1.0000 | MEDICATED_PATCH | TRANSDERMAL | Status: DC

## 2011-08-08 NOTE — Telephone Encounter (Signed)
i refilled 

## 2011-08-16 ENCOUNTER — Other Ambulatory Visit: Payer: Self-pay | Admitting: Physical Medicine & Rehabilitation

## 2011-09-06 ENCOUNTER — Telehealth: Payer: Self-pay

## 2011-09-06 MED ORDER — FENTANYL 12 MCG/HR TD PT72: 1.0000 | MEDICATED_PATCH | TRANSDERMAL | Status: DC

## 2011-09-06 MED ORDER — OXYCODONE HCL 5 MG PO CAPS: 5.0000 mg | ORAL_CAPSULE | Freq: Four times a day (QID) | ORAL | Status: DC | PRN

## 2011-09-06 NOTE — Telephone Encounter (Signed)
Pt mom aware that rx will be ready today or Thursday.

## 2011-09-06 NOTE — Telephone Encounter (Signed)
Pt called for refill request but did not specify what needed.

## 2011-09-14 ENCOUNTER — Other Ambulatory Visit: Payer: Self-pay | Admitting: Physical Medicine & Rehabilitation

## 2011-09-30 ENCOUNTER — Encounter: Payer: Medicaid Other | Attending: Physical Medicine & Rehabilitation | Admitting: Physical Medicine & Rehabilitation

## 2011-09-30 ENCOUNTER — Encounter: Payer: Self-pay | Admitting: Physical Medicine & Rehabilitation

## 2011-09-30 VITALS — BP 135/74 | HR 108 | Resp 14 | Ht 74.0 in | Wt 199.0 lb

## 2011-09-30 DIAGNOSIS — I69959 Hemiplegia and hemiparesis following unspecified cerebrovascular disease affecting unspecified side: Secondary | ICD-10-CM | POA: Insufficient documentation

## 2011-09-30 DIAGNOSIS — I6330 Cerebral infarction due to thrombosis of unspecified cerebral artery: Secondary | ICD-10-CM

## 2011-09-30 DIAGNOSIS — G8110 Spastic hemiplegia affecting unspecified side: Secondary | ICD-10-CM

## 2011-09-30 DIAGNOSIS — F952 Tourette's disorder: Secondary | ICD-10-CM

## 2011-09-30 MED ORDER — FENTANYL 12 MCG/HR TD PT72: 1.0000 | MEDICATED_PATCH | TRANSDERMAL | Status: DC

## 2011-09-30 MED ORDER — OXYCODONE HCL 5 MG PO CAPS: 5.0000 mg | ORAL_CAPSULE | Freq: Four times a day (QID) | ORAL | Status: DC | PRN

## 2011-09-30 NOTE — Patient Instructions (Signed)
You must stretch your arm and leg

## 2011-09-30 NOTE — Progress Notes (Signed)
  Subjective:    Patient ID: [name], male    DOB: Sep 15, 1985, 26 y.o.   MRN: [id number]  HPI  [name]'s back for botox injections today.   Pain Inventory Average Pain 10 Pain Right Now 8 My pain is sharp  In the last 24 hours, has pain interfered with the following? General activity 7 Relation with others 7 Enjoyment of life 9 What TIME of day is your pain at its worst? varies Sleep (in general) Poor  Pain is worse with: walking, bending, sitting, inactivity and standing Pain improves with: rest and therapy/exercise Relief from Meds: 3  Mobility walk with assistance use a cane how many minutes can you walk? as much as possible ability to climb steps?  yes do you drive?  no use a wheelchair transfers alone Do you have any goals in this area?  yes  Function disabled: date disabled  I need assistance with the following:  dressing, bathing, meal prep and household duties Do you have any goals in this area?  yes  Neuro/Psych No problems in this area  Prior Studies Any changes since last visit?  no  Physicians involved in your care Any changes since last visit?  no        Review of Systems  All other systems reviewed and are negative.       Objective:   Physical Exam        Assessment & Plan:  Botox Injection for spasticity using needle EMG guidance  Dilution: 100 Units/ml Indication: Severe spasticity which interferes with ADL,mobility and/or  hygiene and is unresponsive to medication management and other conservative care Informed consent was obtained after describing risks and benefits of the procedure with the patient. This includes bleeding, bruising, infection, excessive weakness, or medication side effects. A REMS form is on file and signed. Needle: 50mm 25g needle electrode Number of units per muscle Right side was injected Gastrocnemius 100u Tibialis posterior 100u EHL 75u Tibialis anterior 25u FCR/FDS/FDP/FCU 100u  All  injections were done after obtaining appropriate EMG activity and after negative drawback for blood. The patient tolerated the procedure well. Post procedure instructions were given. A followup appointment was made.    I also refilled his fentanyl and oxycodone. We re-emphasized stretching.   Will see him back in 3 months.

## 2011-10-11 ENCOUNTER — Other Ambulatory Visit: Payer: Self-pay | Admitting: Physical Medicine & Rehabilitation

## 2011-10-17 ENCOUNTER — Other Ambulatory Visit: Payer: Self-pay | Admitting: Physical Medicine & Rehabilitation

## 2011-10-31 ENCOUNTER — Other Ambulatory Visit: Payer: Self-pay | Admitting: Physical Medicine & Rehabilitation

## 2011-11-02 ENCOUNTER — Telehealth: Payer: Self-pay | Admitting: Physical Medicine & Rehabilitation

## 2011-11-02 DIAGNOSIS — I6330 Cerebral infarction due to thrombosis of unspecified cerebral artery: Secondary | ICD-10-CM

## 2011-11-02 DIAGNOSIS — G8110 Spastic hemiplegia affecting unspecified side: Secondary | ICD-10-CM

## 2011-11-02 MED ORDER — FENTANYL 12 MCG/HR TD PT72: 1.0000 | MEDICATED_PATCH | TRANSDERMAL | Status: DC

## 2011-11-02 MED ORDER — OXYCODONE HCL 5 MG PO CAPS: 5.0000 mg | ORAL_CAPSULE | Freq: Four times a day (QID) | ORAL | Status: DC | PRN

## 2011-11-02 NOTE — Telephone Encounter (Signed)
Pt mother aware that rx are ready for pickup and that we do not give 90 supply of narcs. She understood.

## 2011-11-02 NOTE — Telephone Encounter (Signed)
Refill on patches and Oxycodone.  Requesting 90 day supply.

## 2011-11-02 NOTE — Telephone Encounter (Signed)
Printed rx's for [name] to sign for 30 day supply. We do not give 90 day supply of narcotics.

## 2011-12-13 ENCOUNTER — Telehealth: Payer: Self-pay | Admitting: Physical Medicine & Rehabilitation

## 2011-12-13 DIAGNOSIS — I6330 Cerebral infarction due to thrombosis of unspecified cerebral artery: Secondary | ICD-10-CM

## 2011-12-13 DIAGNOSIS — G8110 Spastic hemiplegia affecting unspecified side: Secondary | ICD-10-CM

## 2011-12-13 MED ORDER — OXYCODONE HCL 5 MG PO CAPS: 5.0000 mg | ORAL_CAPSULE | Freq: Four times a day (QID) | ORAL | Status: DC | PRN

## 2011-12-13 MED ORDER — FENTANYL 12 MCG/HR TD PT72: 1.0000 | MEDICATED_PATCH | TRANSDERMAL | Status: DC

## 2011-12-13 NOTE — Telephone Encounter (Signed)
Refill on patches and Oxycodone.

## 2011-12-13 NOTE — Telephone Encounter (Signed)
Pt mother informed scripts are ready for pick up his father Rayna Sexton will pick up.

## 2011-12-13 NOTE — Telephone Encounter (Signed)
Script printed to be signed.

## 2011-12-14 ENCOUNTER — Telehealth: Payer: Self-pay | Admitting: Physical Medicine & Rehabilitation

## 2011-12-14 NOTE — Telephone Encounter (Signed)
Having problems getting meds filled.

## 2011-12-14 NOTE — Telephone Encounter (Signed)
Pt mother states that we have to call the pharmacy and get clarification on what needs to be done to rx filled, she was quite sure what needed to be done.  When I talked to a pharmacist she said that a prior auth was needed.

## 2011-12-15 ENCOUNTER — Telehealth: Payer: Self-pay | Admitting: Physical Medicine & Rehabilitation

## 2011-12-15 NOTE — Telephone Encounter (Signed)
Pt mother aware that we have done a prior auth.

## 2011-12-15 NOTE — Telephone Encounter (Signed)
Please call.  They are having issues getting his meds.

## 2011-12-15 NOTE — Telephone Encounter (Signed)
Issue getting meds filled

## 2011-12-20 ENCOUNTER — Encounter: Payer: Medicaid Other | Admitting: Physical Medicine & Rehabilitation

## 2011-12-22 ENCOUNTER — Telehealth: Payer: Self-pay | Admitting: Physical Medicine & Rehabilitation

## 2011-12-22 NOTE — Telephone Encounter (Signed)
We have done a prior authorization on his Fentanyl patches. Medicaid still hasn't made a decision. Is there something we can do something for him in the meantime? He is currently on Oxycodone and Fentanyl.

## 2011-12-22 NOTE — Telephone Encounter (Signed)
Pt mother aware.

## 2011-12-22 NOTE — Telephone Encounter (Signed)
He could increase his gabapentin, add 1 tablet at night, if not sufficient add another in the morning after 3 days, or we could add an antiinflammatory ,( has he tolerated a specific one in the past) until he hopefully gets his patches.

## 2011-12-22 NOTE — Telephone Encounter (Signed)
Made appt for 01/20/12.  Needs patches filled.

## 2012-01-09 ENCOUNTER — Telehealth: Payer: Self-pay | Admitting: Physical Medicine & Rehabilitation

## 2012-01-09 DIAGNOSIS — G8110 Spastic hemiplegia affecting unspecified side: Secondary | ICD-10-CM

## 2012-01-09 DIAGNOSIS — I6330 Cerebral infarction due to thrombosis of unspecified cerebral artery: Secondary | ICD-10-CM

## 2012-01-09 NOTE — Telephone Encounter (Signed)
[facility] out of Oxycodone.  Not taking any other pills other than the night pills, Gabapentin, Nortriptyline, Citalopram, Previstsin(?) and Topamax.  But sometimes he throws those away.

## 2012-01-09 NOTE — Telephone Encounter (Signed)
Do you want to refill his Oxycodone?

## 2012-01-10 MED ORDER — OXYCODONE HCL 5 MG PO CAPS: 5.0000 mg | ORAL_CAPSULE | Freq: Four times a day (QID) | ORAL | Status: DC | PRN

## 2012-01-10 NOTE — Telephone Encounter (Signed)
ok 

## 2012-01-11 NOTE — Telephone Encounter (Signed)
Pt mother aware that rx is ready to be picked up.

## 2012-01-17 ENCOUNTER — Other Ambulatory Visit: Payer: Self-pay | Admitting: Physical Medicine & Rehabilitation

## 2012-01-20 ENCOUNTER — Encounter: Payer: Self-pay | Admitting: Physical Medicine & Rehabilitation

## 2012-01-20 ENCOUNTER — Encounter: Payer: Medicaid Other | Attending: Physical Medicine & Rehabilitation | Admitting: Physical Medicine & Rehabilitation

## 2012-01-20 VITALS — BP 128/78 | HR 109 | Resp 14 | Ht 73.0 in | Wt 194.0 lb

## 2012-01-20 DIAGNOSIS — I69959 Hemiplegia and hemiparesis following unspecified cerebrovascular disease affecting unspecified side: Secondary | ICD-10-CM | POA: Insufficient documentation

## 2012-01-20 DIAGNOSIS — S069X9A Unspecified intracranial injury with loss of consciousness of unspecified duration, initial encounter: Secondary | ICD-10-CM

## 2012-01-20 DIAGNOSIS — Z87820 Personal history of traumatic brain injury: Secondary | ICD-10-CM | POA: Insufficient documentation

## 2012-01-20 DIAGNOSIS — R338 Other retention of urine: Secondary | ICD-10-CM | POA: Insufficient documentation

## 2012-01-20 DIAGNOSIS — G8110 Spastic hemiplegia affecting unspecified side: Secondary | ICD-10-CM

## 2012-01-20 DIAGNOSIS — N319 Neuromuscular dysfunction of bladder, unspecified: Secondary | ICD-10-CM | POA: Insufficient documentation

## 2012-01-20 DIAGNOSIS — Z5181 Encounter for therapeutic drug level monitoring: Secondary | ICD-10-CM

## 2012-01-20 DIAGNOSIS — R269 Unspecified abnormalities of gait and mobility: Secondary | ICD-10-CM | POA: Insufficient documentation

## 2012-01-20 DIAGNOSIS — M25569 Pain in unspecified knee: Secondary | ICD-10-CM | POA: Insufficient documentation

## 2012-01-20 DIAGNOSIS — S069X9S Unspecified intracranial injury with loss of consciousness of unspecified duration, sequela: Secondary | ICD-10-CM

## 2012-01-20 DIAGNOSIS — I6330 Cerebral infarction due to thrombosis of unspecified cerebral artery: Secondary | ICD-10-CM

## 2012-01-20 DIAGNOSIS — R4586 Emotional lability: Secondary | ICD-10-CM | POA: Insufficient documentation

## 2012-01-20 MED ORDER — OXYCODONE HCL 5 MG PO CAPS: 5.0000 mg | ORAL_CAPSULE | Freq: Four times a day (QID) | ORAL | Status: DC | PRN

## 2012-01-20 MED ORDER — TAMSULOSIN HCL 0.4 MG PO CAPS: 0.4000 mg | ORAL_CAPSULE | Freq: Every day | ORAL | Status: AC

## 2012-01-20 NOTE — Patient Instructions (Signed)
Continue stretching your arm and leg!

## 2012-01-20 NOTE — Progress Notes (Signed)
Subjective:    Patient ID: [name], male    DOB: April 26, 1986, 26 y.o.   MRN: [id number]  HPI  Derico is back regarding his spastic right hemiparesis. He had positive results from the botox injections. He's having difficulty urinating and typically only goes to the bathroom once a day. He does report lower abdominal distention. He may urinate up to 3 x per day.   He is walking frequently, taking his dogs out for walks. He cuts the grass also. He complains of increased right knee pain and the "current medication isn't enough"  Behavior has been somewhat improved.  Pain Inventory Average Pain 8 Pain Right Now 8 My pain is sharp, stabbing and aching  In the last 24 hours, has pain interfered with the following? General activity 7 Relation with others 6 Enjoyment of life 5 What TIME of day is your pain at its worst? all the time Sleep (in general) Poor  Pain is worse with: walking and some activites Pain improves with: medication Relief from Meds: 2  Mobility use a cane how many minutes can you walk? alot when not hurting ability to climb steps?  yes do you drive?  no needs help with transfers Do you have any goals in this area?  yes  Function not employed: date last employed 09/2010 disabled: date disabled 09/2010 I need assistance with the following:  dressing, bathing, meal prep and household duties Do you have any goals in this area?  yes  Neuro/Psych bladder control problems bowel control problems weakness numbness trouble walking spasms  Prior Studies Any changes since last visit?  no  Physicians involved in your care Any changes since last visit?  no   Family History  Problem Relation Age of Onset  . Arthritis Mother   . Anxiety disorder Mother   . Diabetes Father    History   Social History  . Marital Status: Single    Spouse Name: N/A    Number of Children: N/A  . Years of Education: N/A   Social History Main Topics  . Smoking status:  Current Everyday Smoker    Types: Cigarettes  . Smokeless tobacco: Current User  . Alcohol Use: Yes  . Drug Use: No  . Sexually Active: None   Other Topics Concern  . None   Social History Narrative  . None   Past Surgical History  Procedure Date  . Eye surgery   . Ankle surgery   . Tonsilectomy, adenoidectomy, bilateral myringotomy and tubes    Past Medical History  Diagnosis Date  . Cerebral thrombosis with cerebral infarction   . Spastic hemiplegia affecting dominant side   . TBI (traumatic brain injury)   . Headache   . Asthma   . Stroke    BP 128/78  Pulse 109  Resp 14  Ht 6\' 1"  (1.854 m)  Wt 194 lb (87.998 kg)  BMI 25.60 kg/m2  SpO2 100%     Review of Systems  Gastrointestinal: Positive for constipation.  Genitourinary: Positive for dysuria and difficulty urinating.  Musculoskeletal: Positive for myalgias, arthralgias and gait problem.  Neurological: Positive for weakness and numbness.  All other systems reviewed and are negative.       Objective:   Physical Exam  General: Alert and oriented x 3, No apparent distress HEENT: Head is normocephalic, atraumatic, PERRLA, EOMI, sclera anicteric, oral mucosa pink and moist, dentition intact, ext ear canals clear,  Neck: Supple without JVD or lymphadenopathy Heart: Reg rate and rhythm. No  murmurs rubs or gallops Chest: CTA bilaterally without wheezes, rales, or rhonchi; no distress Abdomen: Soft, non-tender, non-distended, bowel sounds positive. Extremities: No clubbing, cyanosis, or edema. Pulses are 2+ Skin: Clean and intact without signs of breakdown Neuro: Pt is cognitively appropriate with normal insight, memory, and awareness. A little less impulsive today. Right UE 2/5 with tone 2-3 at wrist and FF's. RLE 2-3 with weakness distally. Tone is 2/4. Was able to move foot to neutral. Musculoskeletal: Full ROM, No pain with AROM or PROM in the neck, trunk. Left knee with minimal pain upon ROM. Posture  appropriate. He bears more weight on the left leg with gait.  Psych: Pt's affect is appropriate. Pt is cooperative         Assessment & Plan:  1. Left CVA with spastic right hemiparesis 2. Hx of TBI, behavioral lability 3. Urinary retention/neurogenic 4. Left knee pain, due to overuse, gait pattern  Plan: 1.Will check renal ultrasound, add flomax. Consider urology consult 2.Will set up for botox WF, FDP, FDS 400 units, consider further gastroc injections also 3. Refill pain meds today, UDS was collected. 4. Follow up in one month for injections 5. Recommended OTC naproxen 1-2 tabs q12 hours prn for left knee. He also needs to work on better technique and try not to "over do" things.

## 2012-01-24 ENCOUNTER — Telehealth: Payer: Self-pay

## 2012-01-24 NOTE — Telephone Encounter (Signed)
Lm advising patient that in order for him to get his oxycodone script he would need to leave give a urine sample.  Patient was aware of this the day he was in the office.

## 2012-02-02 ENCOUNTER — Other Ambulatory Visit (HOSPITAL_COMMUNITY): Payer: Medicaid Other

## 2012-02-13 ENCOUNTER — Ambulatory Visit: Payer: Medicaid Other | Admitting: Physical Medicine & Rehabilitation

## 2012-02-13 ENCOUNTER — Other Ambulatory Visit: Payer: Self-pay | Admitting: Physical Medicine & Rehabilitation

## 2012-04-18 ENCOUNTER — Other Ambulatory Visit: Payer: Self-pay | Admitting: *Deleted

## 2012-04-18 MED ORDER — TOPIRAMATE 100 MG PO TABS: ORAL_TABLET | ORAL | Status: DC

## 2012-05-17 ENCOUNTER — Other Ambulatory Visit: Payer: Self-pay | Admitting: Physical Medicine & Rehabilitation

## 2012-07-14 ENCOUNTER — Other Ambulatory Visit: Payer: Self-pay | Admitting: Physical Medicine & Rehabilitation

## 2012-07-16 ENCOUNTER — Telehealth: Payer: Self-pay

## 2012-07-16 NOTE — Telephone Encounter (Signed)
Erroneous encounter

## 2012-09-02 IMAGING — CT CT ANGIO HEAD
1 of 11 series · 4 of 33 positions shown · IV contrast (omnipaque)
Comparison: CT of the head without contrast at [facility]
09/19/2010 and 09/17/2010.

CTA NECK

CLINICAL DATA: Right-sided paralysis.  Left sided stroke.

CT ANGIOGRAPHY HEAD AND NECK
TECHNIQUE: Multidetector CT imaging of the head and neck was
performed using the standard protocol during bolus administration
of intravenous contrast.  Multiplanar CT image reconstructions
including MIPs were obtained to evaluate the vascular anatomy.
Carotid stenosis measurements (when applicable) are obtained
utilizing NASCET criteria, using the distal internal carotid
diameter as the denominator.
Contrast:  50 ml Omnipaque 350

[mpr, axial, axial · axial · 0.51mm/px · z∈[+104,+329]mm · 4 of 376 slices shown]
[im 76/376  soft-tissue]
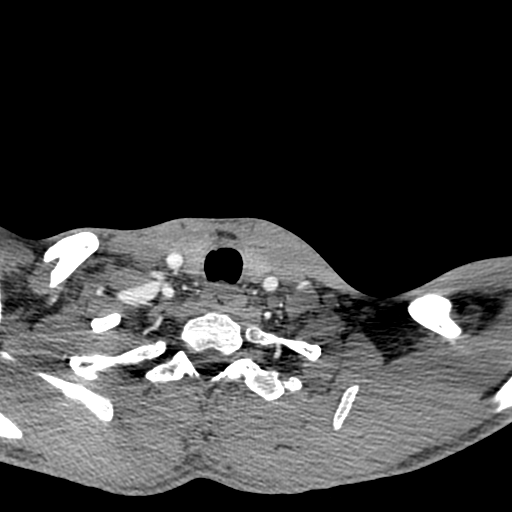
[im 151/376  bone]
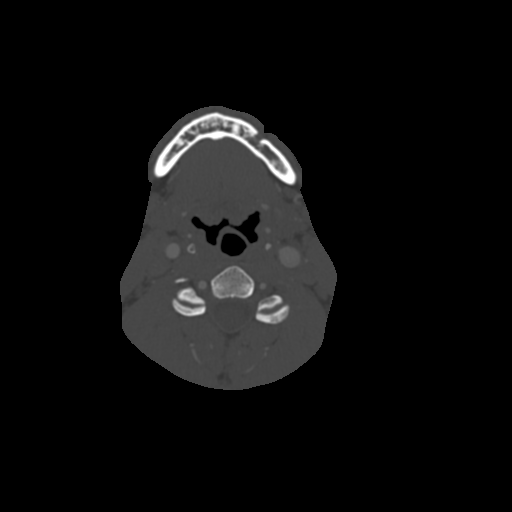
[im 226/376  soft-tissue]
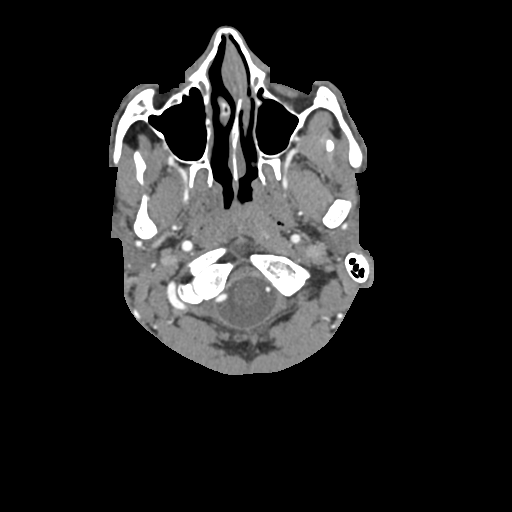
[im 301/376  bone]
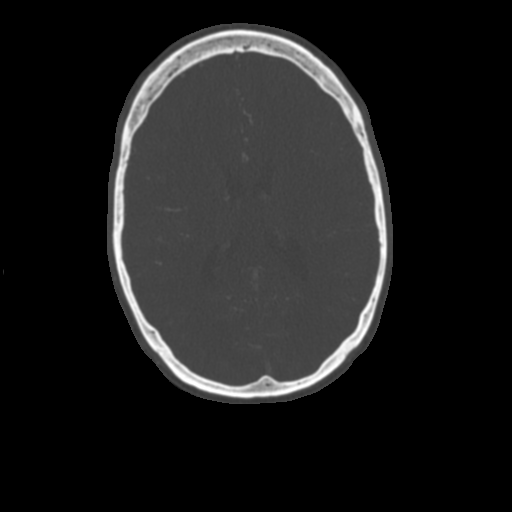

[4 of 33 positions shown; findings below may reference images not displayed]

FINDINGS: A standard three-vessel arch configuration is present.
The vertebral arteries both originate from the subclavian arteries.
The right vertebral artery is dominant.  Vertebral artery is within
normal limits throughout the neck.

The right common carotid artery is within normal limits.  The
bifurcation is unremarkable.  The cervical right internal carotid
artery is normal.

The left common carotid artery is within normal limits.  The
bifurcation is unremarkable.  The cervical left internal carotid
artery is normal.

The soft tissues of the neck demonstrate to bilateral reactive type
lymph nodes.  This is likely within normal limits for age.  The
bone windows are unremarkable.  Large dental caries are seen
bilaterally.

 Review of the MIP images confirms the above findings.
IMPRESSION: 1.  Normal vasculature of the neck.
2.  Large bilateral dental caries.

CTA HEAD
FINDINGS: Noncontrast CT of the head demonstrates no significant
change of the left basal ganglia infarct.  This involves portions
of the thalamus and posterior limb of the internal capsule. The
infarct may include the globus pallidus as well.  No new infarct is
present.  There is no hemorrhage or mass lesion.  The ventricles
are of normal size.  No significant extra-axial fluid collection is
present.

The paranasal sinuses and mastoid air cells are clear.  The osseous
skull is intact.

The CTA images demonstrate a normal appearance of the intracranial
right internal carotid artery.  The right A1 and M1 segments are
normal.  The anterior communicating artery is patent.

The left internal carotid artery is of diminished caliber beginning
with the petrous segment.  There is a near occlusive critical
stenosis within the supraclinoid segment.  The A1 and M1 segments
are very small.  The left A2 segment is of normal caliber, beyond
the anterior communicating artery.  There is a blush of contrast
near the ICA terminus on the left.  The left MCA branch vessels are
significantly attenuated compared to the right.  No significant
proximal occlusion is evident.  The ACA branch vessels are within
normal limits.

The right vertebral artery is the dominant vessel.  The PICA
origins are visualized and within normal limits.  The basilar
artery is normal.  Both posterior cerebral arteries originate from
the basilar tip.  A right posterior communicating artery
contributes.

 Review of the MIP images confirms the above findings.
IMPRESSION: 1.  Critical, near occlusive stenosis of the terminal left internal
carotid artery.
2.  Ill-defined blush of contrast near the left ICA terminus may
represent early development of moya moya.
3.  Significant attenuation of left MCA branch vessels compared to
the right.
4.  The left A2 segment is of normal caliber beyond the anterior
communicating artery.
5.  Stable appearance of an infarct involving the anterior left
thalamus, globus pallidus, and the internal capsule.

The signs were discussed with Dr. Levy Labrada at [DATE] p.m.
10/04/2010

## 2012-09-05 IMAGING — CR DG CERVICAL SPINE 2 OR 3 VIEWS
1 series · 1 of 1 positions shown · non-contrast
Comparison: Neck CTA 10/04/2010.

CLINICAL DATA: Neck pain.  Possible injury.

CERVICAL SPINE - 2-3 VIEW

[view not recorded]
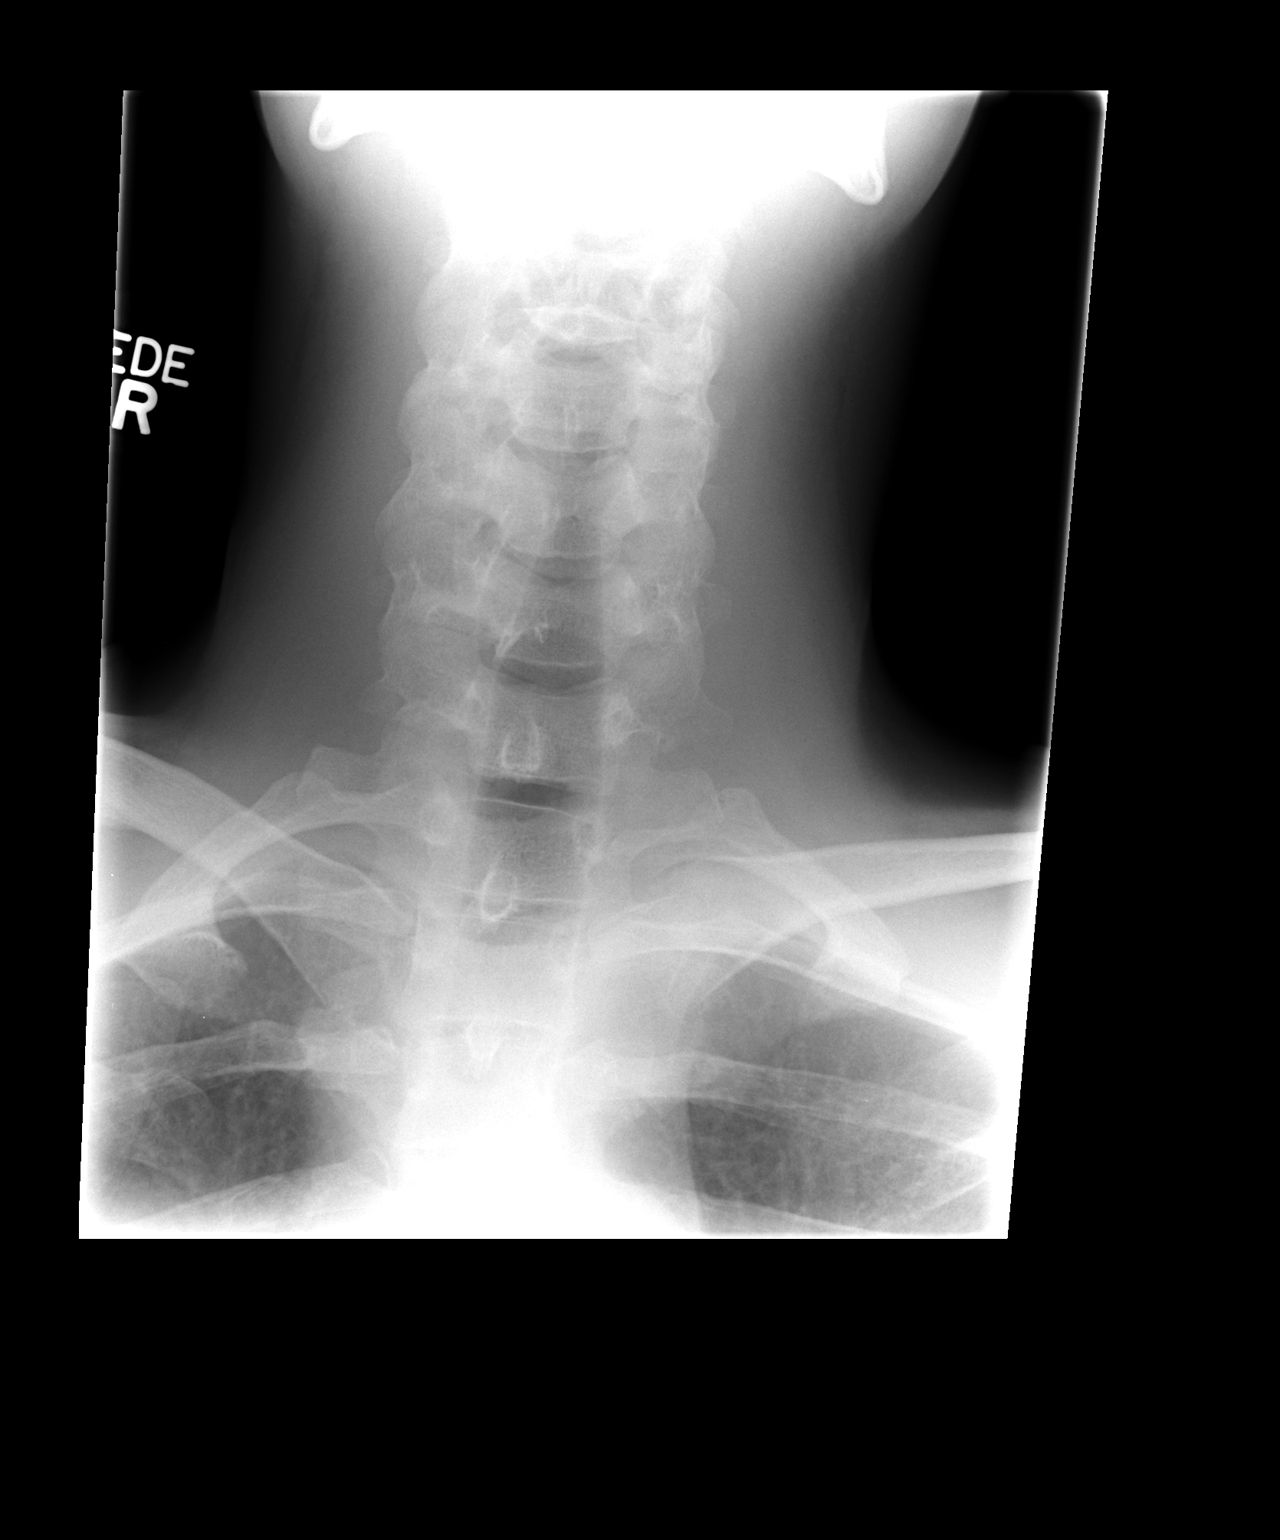

[1 of 1 positions shown; findings below may reference images not displayed]

FINDINGS: The prevertebral soft tissues are normal.  The alignment
is anatomic.  Disc space evaluation on the lateral view is limited
by obliquity.  There is no evidence of acute fracture or
subluxation. Study does not include an AP odontoid view.
IMPRESSION: No acute osseous findings.  Normal alignment.

## 2014-06-10 DIAGNOSIS — R591 Generalized enlarged lymph nodes: Secondary | ICD-10-CM | POA: Diagnosis not present

## 2014-06-10 DIAGNOSIS — F17210 Nicotine dependence, cigarettes, uncomplicated: Secondary | ICD-10-CM | POA: Diagnosis not present

## 2014-09-22 DIAGNOSIS — E785 Hyperlipidemia, unspecified: Secondary | ICD-10-CM | POA: Diagnosis not present

## 2014-09-22 DIAGNOSIS — R51 Headache: Secondary | ICD-10-CM | POA: Diagnosis not present

## 2014-09-22 DIAGNOSIS — J45909 Unspecified asthma, uncomplicated: Secondary | ICD-10-CM | POA: Diagnosis not present

## 2014-09-22 DIAGNOSIS — J301 Allergic rhinitis due to pollen: Secondary | ICD-10-CM | POA: Diagnosis not present

## 2014-09-22 DIAGNOSIS — F329 Major depressive disorder, single episode, unspecified: Secondary | ICD-10-CM | POA: Diagnosis not present

## 2014-09-22 DIAGNOSIS — J019 Acute sinusitis, unspecified: Secondary | ICD-10-CM | POA: Diagnosis not present

## 2014-09-22 DIAGNOSIS — M545 Low back pain: Secondary | ICD-10-CM | POA: Diagnosis not present

## 2014-09-22 DIAGNOSIS — I6789 Other cerebrovascular disease: Secondary | ICD-10-CM | POA: Diagnosis not present

## 2014-10-09 DIAGNOSIS — M545 Low back pain: Secondary | ICD-10-CM | POA: Diagnosis not present

## 2014-10-09 DIAGNOSIS — R262 Difficulty in walking, not elsewhere classified: Secondary | ICD-10-CM | POA: Diagnosis not present

## 2014-10-20 DIAGNOSIS — R262 Difficulty in walking, not elsewhere classified: Secondary | ICD-10-CM | POA: Diagnosis not present

## 2014-10-20 DIAGNOSIS — M545 Low back pain: Secondary | ICD-10-CM | POA: Diagnosis not present

## 2014-10-22 DIAGNOSIS — R262 Difficulty in walking, not elsewhere classified: Secondary | ICD-10-CM | POA: Diagnosis not present

## 2014-10-22 DIAGNOSIS — M545 Low back pain: Secondary | ICD-10-CM | POA: Diagnosis not present

## 2014-11-03 DIAGNOSIS — M545 Low back pain: Secondary | ICD-10-CM | POA: Diagnosis not present

## 2014-11-03 DIAGNOSIS — R262 Difficulty in walking, not elsewhere classified: Secondary | ICD-10-CM | POA: Diagnosis not present

## 2014-11-05 DIAGNOSIS — M545 Low back pain: Secondary | ICD-10-CM | POA: Diagnosis not present

## 2014-11-05 DIAGNOSIS — R262 Difficulty in walking, not elsewhere classified: Secondary | ICD-10-CM | POA: Diagnosis not present

## 2014-11-10 DIAGNOSIS — M545 Low back pain: Secondary | ICD-10-CM | POA: Diagnosis not present

## 2014-11-10 DIAGNOSIS — R262 Difficulty in walking, not elsewhere classified: Secondary | ICD-10-CM | POA: Diagnosis not present

## 2014-11-13 DIAGNOSIS — M545 Low back pain: Secondary | ICD-10-CM | POA: Diagnosis not present

## 2014-11-13 DIAGNOSIS — R262 Difficulty in walking, not elsewhere classified: Secondary | ICD-10-CM | POA: Diagnosis not present

## 2014-11-19 DIAGNOSIS — R262 Difficulty in walking, not elsewhere classified: Secondary | ICD-10-CM | POA: Diagnosis not present

## 2014-11-19 DIAGNOSIS — M545 Low back pain: Secondary | ICD-10-CM | POA: Diagnosis not present

## 2014-11-26 DIAGNOSIS — M545 Low back pain: Secondary | ICD-10-CM | POA: Diagnosis not present

## 2014-11-26 DIAGNOSIS — R262 Difficulty in walking, not elsewhere classified: Secondary | ICD-10-CM | POA: Diagnosis not present

## 2014-12-05 DIAGNOSIS — M545 Low back pain: Secondary | ICD-10-CM | POA: Diagnosis not present

## 2014-12-05 DIAGNOSIS — R262 Difficulty in walking, not elsewhere classified: Secondary | ICD-10-CM | POA: Diagnosis not present

## 2014-12-09 DIAGNOSIS — R262 Difficulty in walking, not elsewhere classified: Secondary | ICD-10-CM | POA: Diagnosis not present

## 2014-12-09 DIAGNOSIS — M545 Low back pain: Secondary | ICD-10-CM | POA: Diagnosis not present

## 2014-12-11 DIAGNOSIS — R262 Difficulty in walking, not elsewhere classified: Secondary | ICD-10-CM | POA: Diagnosis not present

## 2014-12-11 DIAGNOSIS — M545 Low back pain: Secondary | ICD-10-CM | POA: Diagnosis not present

## 2014-12-17 DIAGNOSIS — M545 Low back pain: Secondary | ICD-10-CM | POA: Diagnosis not present

## 2014-12-17 DIAGNOSIS — R262 Difficulty in walking, not elsewhere classified: Secondary | ICD-10-CM | POA: Diagnosis not present

## 2014-12-19 DIAGNOSIS — M545 Low back pain: Secondary | ICD-10-CM | POA: Diagnosis not present

## 2014-12-19 DIAGNOSIS — R262 Difficulty in walking, not elsewhere classified: Secondary | ICD-10-CM | POA: Diagnosis not present

## 2014-12-24 DIAGNOSIS — R262 Difficulty in walking, not elsewhere classified: Secondary | ICD-10-CM | POA: Diagnosis not present

## 2014-12-24 DIAGNOSIS — M545 Low back pain: Secondary | ICD-10-CM | POA: Diagnosis not present

## 2014-12-26 DIAGNOSIS — M545 Low back pain: Secondary | ICD-10-CM | POA: Diagnosis not present

## 2014-12-26 DIAGNOSIS — R262 Difficulty in walking, not elsewhere classified: Secondary | ICD-10-CM | POA: Diagnosis not present

## 2014-12-29 DIAGNOSIS — M545 Low back pain: Secondary | ICD-10-CM | POA: Diagnosis not present

## 2014-12-29 DIAGNOSIS — R262 Difficulty in walking, not elsewhere classified: Secondary | ICD-10-CM | POA: Diagnosis not present

## 2014-12-31 DIAGNOSIS — M545 Low back pain: Secondary | ICD-10-CM | POA: Diagnosis not present

## 2014-12-31 DIAGNOSIS — R262 Difficulty in walking, not elsewhere classified: Secondary | ICD-10-CM | POA: Diagnosis not present

## 2015-01-06 DIAGNOSIS — M545 Low back pain: Secondary | ICD-10-CM | POA: Diagnosis not present

## 2015-01-06 DIAGNOSIS — R262 Difficulty in walking, not elsewhere classified: Secondary | ICD-10-CM | POA: Diagnosis not present

## 2015-01-07 DIAGNOSIS — R262 Difficulty in walking, not elsewhere classified: Secondary | ICD-10-CM | POA: Diagnosis not present

## 2015-01-07 DIAGNOSIS — M545 Low back pain: Secondary | ICD-10-CM | POA: Diagnosis not present

## 2015-01-12 DIAGNOSIS — R262 Difficulty in walking, not elsewhere classified: Secondary | ICD-10-CM | POA: Diagnosis not present

## 2015-01-12 DIAGNOSIS — M545 Low back pain: Secondary | ICD-10-CM | POA: Diagnosis not present

## 2015-01-14 DIAGNOSIS — M545 Low back pain: Secondary | ICD-10-CM | POA: Diagnosis not present

## 2015-01-14 DIAGNOSIS — R262 Difficulty in walking, not elsewhere classified: Secondary | ICD-10-CM | POA: Diagnosis not present

## 2015-01-27 DIAGNOSIS — R262 Difficulty in walking, not elsewhere classified: Secondary | ICD-10-CM | POA: Diagnosis not present

## 2015-01-27 DIAGNOSIS — M545 Low back pain: Secondary | ICD-10-CM | POA: Diagnosis not present

## 2015-03-18 DIAGNOSIS — R5381 Other malaise: Secondary | ICD-10-CM | POA: Diagnosis not present

## 2015-03-18 DIAGNOSIS — M818 Other osteoporosis without current pathological fracture: Secondary | ICD-10-CM | POA: Diagnosis not present

## 2015-03-18 DIAGNOSIS — Z79899 Other long term (current) drug therapy: Secondary | ICD-10-CM | POA: Diagnosis not present

## 2015-03-18 DIAGNOSIS — J45909 Unspecified asthma, uncomplicated: Secondary | ICD-10-CM | POA: Diagnosis not present

## 2015-03-18 DIAGNOSIS — E785 Hyperlipidemia, unspecified: Secondary | ICD-10-CM | POA: Diagnosis not present

## 2015-12-04 DIAGNOSIS — L309 Dermatitis, unspecified: Secondary | ICD-10-CM | POA: Diagnosis not present

## 2015-12-04 DIAGNOSIS — M5441 Lumbago with sciatica, right side: Secondary | ICD-10-CM | POA: Diagnosis not present

## 2015-12-14 DIAGNOSIS — S0012XA Contusion of left eyelid and periocular area, initial encounter: Secondary | ICD-10-CM | POA: Diagnosis not present

## 2015-12-14 DIAGNOSIS — S20219A Contusion of unspecified front wall of thorax, initial encounter: Secondary | ICD-10-CM | POA: Diagnosis not present

## 2015-12-14 DIAGNOSIS — S098XXA Other specified injuries of head, initial encounter: Secondary | ICD-10-CM | POA: Diagnosis not present

## 2015-12-14 DIAGNOSIS — S299XXA Unspecified injury of thorax, initial encounter: Secondary | ICD-10-CM | POA: Diagnosis not present

## 2015-12-14 DIAGNOSIS — S022XXA Fracture of nasal bones, initial encounter for closed fracture: Secondary | ICD-10-CM | POA: Diagnosis not present

## 2015-12-14 DIAGNOSIS — M25531 Pain in right wrist: Secondary | ICD-10-CM | POA: Diagnosis not present

## 2015-12-14 DIAGNOSIS — M5441 Lumbago with sciatica, right side: Secondary | ICD-10-CM | POA: Diagnosis not present

## 2015-12-14 DIAGNOSIS — S6991XA Unspecified injury of right wrist, hand and finger(s), initial encounter: Secondary | ICD-10-CM | POA: Diagnosis not present

## 2015-12-14 DIAGNOSIS — S60221A Contusion of right hand, initial encounter: Secondary | ICD-10-CM | POA: Diagnosis not present

## 2015-12-14 DIAGNOSIS — I69951 Hemiplegia and hemiparesis following unspecified cerebrovascular disease affecting right dominant side: Secondary | ICD-10-CM | POA: Diagnosis not present

## 2015-12-14 DIAGNOSIS — S20211A Contusion of right front wall of thorax, initial encounter: Secondary | ICD-10-CM | POA: Diagnosis not present

## 2015-12-14 DIAGNOSIS — S199XXA Unspecified injury of neck, initial encounter: Secondary | ICD-10-CM | POA: Diagnosis not present

## 2015-12-14 DIAGNOSIS — S60211A Contusion of right wrist, initial encounter: Secondary | ICD-10-CM | POA: Diagnosis not present

## 2015-12-14 DIAGNOSIS — M545 Low back pain: Secondary | ICD-10-CM | POA: Diagnosis not present

## 2015-12-14 DIAGNOSIS — S0993XA Unspecified injury of face, initial encounter: Secondary | ICD-10-CM | POA: Diagnosis not present

## 2015-12-14 DIAGNOSIS — Z9181 History of falling: Secondary | ICD-10-CM | POA: Diagnosis not present

## 2015-12-14 DIAGNOSIS — S62636A Displaced fracture of distal phalanx of right little finger, initial encounter for closed fracture: Secondary | ICD-10-CM | POA: Diagnosis not present

## 2015-12-14 DIAGNOSIS — Z23 Encounter for immunization: Secondary | ICD-10-CM | POA: Diagnosis not present

## 2015-12-15 DIAGNOSIS — S62636A Displaced fracture of distal phalanx of right little finger, initial encounter for closed fracture: Secondary | ICD-10-CM | POA: Diagnosis not present

## 2015-12-15 DIAGNOSIS — S299XXA Unspecified injury of thorax, initial encounter: Secondary | ICD-10-CM | POA: Diagnosis not present

## 2015-12-18 DIAGNOSIS — E785 Hyperlipidemia, unspecified: Secondary | ICD-10-CM | POA: Diagnosis not present

## 2015-12-18 DIAGNOSIS — Z79899 Other long term (current) drug therapy: Secondary | ICD-10-CM | POA: Diagnosis not present

## 2015-12-18 DIAGNOSIS — S20212S Contusion of left front wall of thorax, sequela: Secondary | ICD-10-CM | POA: Diagnosis not present

## 2015-12-18 DIAGNOSIS — S022XXA Fracture of nasal bones, initial encounter for closed fracture: Secondary | ICD-10-CM | POA: Diagnosis not present

## 2015-12-18 DIAGNOSIS — Z6823 Body mass index (BMI) 23.0-23.9, adult: Secondary | ICD-10-CM | POA: Diagnosis not present

## 2015-12-21 DIAGNOSIS — J342 Deviated nasal septum: Secondary | ICD-10-CM | POA: Diagnosis not present

## 2015-12-21 DIAGNOSIS — R0981 Nasal congestion: Secondary | ICD-10-CM | POA: Diagnosis not present

## 2015-12-21 DIAGNOSIS — J343 Hypertrophy of nasal turbinates: Secondary | ICD-10-CM | POA: Diagnosis not present

## 2015-12-21 DIAGNOSIS — J322 Chronic ethmoidal sinusitis: Secondary | ICD-10-CM | POA: Diagnosis not present

## 2015-12-21 DIAGNOSIS — S022XXA Fracture of nasal bones, initial encounter for closed fracture: Secondary | ICD-10-CM | POA: Diagnosis not present

## 2016-01-13 DIAGNOSIS — Z6823 Body mass index (BMI) 23.0-23.9, adult: Secondary | ICD-10-CM | POA: Diagnosis not present

## 2016-01-13 DIAGNOSIS — E785 Hyperlipidemia, unspecified: Secondary | ICD-10-CM | POA: Diagnosis not present

## 2016-01-13 DIAGNOSIS — I639 Cerebral infarction, unspecified: Secondary | ICD-10-CM | POA: Diagnosis not present

## 2016-01-13 DIAGNOSIS — Z01818 Encounter for other preprocedural examination: Secondary | ICD-10-CM | POA: Diagnosis not present

## 2016-01-15 DIAGNOSIS — J45909 Unspecified asthma, uncomplicated: Secondary | ICD-10-CM | POA: Diagnosis not present

## 2016-01-15 DIAGNOSIS — J343 Hypertrophy of nasal turbinates: Secondary | ICD-10-CM | POA: Diagnosis not present

## 2016-01-15 DIAGNOSIS — J342 Deviated nasal septum: Secondary | ICD-10-CM | POA: Diagnosis not present

## 2016-01-15 DIAGNOSIS — Z8673 Personal history of transient ischemic attack (TIA), and cerebral infarction without residual deficits: Secondary | ICD-10-CM | POA: Diagnosis not present

## 2016-01-15 DIAGNOSIS — Z79899 Other long term (current) drug therapy: Secondary | ICD-10-CM | POA: Diagnosis not present

## 2016-01-15 DIAGNOSIS — I10 Essential (primary) hypertension: Secondary | ICD-10-CM | POA: Diagnosis not present

## 2016-01-15 DIAGNOSIS — Z7982 Long term (current) use of aspirin: Secondary | ICD-10-CM | POA: Diagnosis not present

## 2016-01-15 DIAGNOSIS — J322 Chronic ethmoidal sinusitis: Secondary | ICD-10-CM | POA: Diagnosis not present

## 2016-01-15 DIAGNOSIS — J324 Chronic pansinusitis: Secondary | ICD-10-CM | POA: Diagnosis not present

## 2016-01-15 DIAGNOSIS — J321 Chronic frontal sinusitis: Secondary | ICD-10-CM | POA: Diagnosis not present

## 2016-01-15 DIAGNOSIS — S022XXA Fracture of nasal bones, initial encounter for closed fracture: Secondary | ICD-10-CM | POA: Diagnosis not present

## 2016-01-15 DIAGNOSIS — J3489 Other specified disorders of nose and nasal sinuses: Secondary | ICD-10-CM | POA: Diagnosis not present

## 2016-01-15 DIAGNOSIS — J320 Chronic maxillary sinusitis: Secondary | ICD-10-CM | POA: Diagnosis not present

## 2016-01-19 DIAGNOSIS — J320 Chronic maxillary sinusitis: Secondary | ICD-10-CM | POA: Diagnosis not present

## 2016-01-19 DIAGNOSIS — J343 Hypertrophy of nasal turbinates: Secondary | ICD-10-CM | POA: Diagnosis not present

## 2016-01-19 DIAGNOSIS — J324 Chronic pansinusitis: Secondary | ICD-10-CM | POA: Diagnosis not present

## 2016-01-19 DIAGNOSIS — I10 Essential (primary) hypertension: Secondary | ICD-10-CM | POA: Diagnosis not present

## 2016-01-19 DIAGNOSIS — R0981 Nasal congestion: Secondary | ICD-10-CM | POA: Diagnosis not present

## 2016-01-19 DIAGNOSIS — J3489 Other specified disorders of nose and nasal sinuses: Secondary | ICD-10-CM | POA: Diagnosis not present

## 2016-01-19 DIAGNOSIS — J322 Chronic ethmoidal sinusitis: Secondary | ICD-10-CM | POA: Diagnosis not present

## 2016-01-19 DIAGNOSIS — J342 Deviated nasal septum: Secondary | ICD-10-CM | POA: Diagnosis not present

## 2016-01-19 DIAGNOSIS — J019 Acute sinusitis, unspecified: Secondary | ICD-10-CM | POA: Diagnosis not present

## 2016-01-19 DIAGNOSIS — Z7982 Long term (current) use of aspirin: Secondary | ICD-10-CM | POA: Diagnosis not present

## 2016-01-19 DIAGNOSIS — Z8673 Personal history of transient ischemic attack (TIA), and cerebral infarction without residual deficits: Secondary | ICD-10-CM | POA: Diagnosis not present

## 2016-01-19 DIAGNOSIS — J45909 Unspecified asthma, uncomplicated: Secondary | ICD-10-CM | POA: Diagnosis not present

## 2016-01-19 DIAGNOSIS — Z79899 Other long term (current) drug therapy: Secondary | ICD-10-CM | POA: Diagnosis not present

## 2016-01-19 DIAGNOSIS — J321 Chronic frontal sinusitis: Secondary | ICD-10-CM | POA: Diagnosis not present

## 2016-01-19 DIAGNOSIS — S022XXA Fracture of nasal bones, initial encounter for closed fracture: Secondary | ICD-10-CM | POA: Diagnosis not present

## 2016-01-27 DIAGNOSIS — J329 Chronic sinusitis, unspecified: Secondary | ICD-10-CM | POA: Diagnosis not present

## 2016-02-04 DIAGNOSIS — J329 Chronic sinusitis, unspecified: Secondary | ICD-10-CM | POA: Diagnosis not present

## 2016-02-12 DIAGNOSIS — J329 Chronic sinusitis, unspecified: Secondary | ICD-10-CM | POA: Diagnosis not present

## 2016-02-18 DIAGNOSIS — J329 Chronic sinusitis, unspecified: Secondary | ICD-10-CM | POA: Diagnosis not present

## 2016-02-22 DIAGNOSIS — T162XXA Foreign body in left ear, initial encounter: Secondary | ICD-10-CM | POA: Diagnosis not present

## 2016-02-22 DIAGNOSIS — H60312 Diffuse otitis externa, left ear: Secondary | ICD-10-CM | POA: Diagnosis not present

## 2016-02-22 DIAGNOSIS — H6122 Impacted cerumen, left ear: Secondary | ICD-10-CM | POA: Diagnosis not present

## 2016-03-10 DIAGNOSIS — H60312 Diffuse otitis externa, left ear: Secondary | ICD-10-CM | POA: Diagnosis not present

## 2016-04-14 DIAGNOSIS — E785 Hyperlipidemia, unspecified: Secondary | ICD-10-CM | POA: Diagnosis not present

## 2016-04-14 DIAGNOSIS — K648 Other hemorrhoids: Secondary | ICD-10-CM | POA: Diagnosis not present

## 2016-04-14 DIAGNOSIS — Z6823 Body mass index (BMI) 23.0-23.9, adult: Secondary | ICD-10-CM | POA: Diagnosis not present

## 2016-06-10 DIAGNOSIS — J329 Chronic sinusitis, unspecified: Secondary | ICD-10-CM | POA: Diagnosis not present

## 2016-06-10 DIAGNOSIS — Z9889 Other specified postprocedural states: Secondary | ICD-10-CM | POA: Diagnosis not present

## 2016-06-10 DIAGNOSIS — J3489 Other specified disorders of nose and nasal sinuses: Secondary | ICD-10-CM | POA: Diagnosis not present

## 2016-07-22 DIAGNOSIS — E785 Hyperlipidemia, unspecified: Secondary | ICD-10-CM | POA: Diagnosis not present

## 2016-07-22 DIAGNOSIS — Z6823 Body mass index (BMI) 23.0-23.9, adult: Secondary | ICD-10-CM | POA: Diagnosis not present

## 2016-07-22 DIAGNOSIS — M818 Other osteoporosis without current pathological fracture: Secondary | ICD-10-CM | POA: Diagnosis not present

## 2016-07-22 DIAGNOSIS — I639 Cerebral infarction, unspecified: Secondary | ICD-10-CM | POA: Diagnosis not present

## 2016-07-22 DIAGNOSIS — Z79899 Other long term (current) drug therapy: Secondary | ICD-10-CM | POA: Diagnosis not present

## 2016-07-22 DIAGNOSIS — E0590 Thyrotoxicosis, unspecified without thyrotoxic crisis or storm: Secondary | ICD-10-CM | POA: Diagnosis not present

## 2016-07-22 DIAGNOSIS — I6789 Other cerebrovascular disease: Secondary | ICD-10-CM | POA: Diagnosis not present

## 2016-09-09 DIAGNOSIS — Z9889 Other specified postprocedural states: Secondary | ICD-10-CM | POA: Diagnosis not present

## 2016-09-09 DIAGNOSIS — J3489 Other specified disorders of nose and nasal sinuses: Secondary | ICD-10-CM | POA: Diagnosis not present

## 2016-09-09 DIAGNOSIS — Z8709 Personal history of other diseases of the respiratory system: Secondary | ICD-10-CM | POA: Diagnosis not present

## 2016-12-14 DIAGNOSIS — Z79899 Other long term (current) drug therapy: Secondary | ICD-10-CM | POA: Diagnosis not present

## 2016-12-14 DIAGNOSIS — I6789 Other cerebrovascular disease: Secondary | ICD-10-CM | POA: Diagnosis not present

## 2016-12-14 DIAGNOSIS — E785 Hyperlipidemia, unspecified: Secondary | ICD-10-CM | POA: Diagnosis not present

## 2016-12-14 DIAGNOSIS — M818 Other osteoporosis without current pathological fracture: Secondary | ICD-10-CM | POA: Diagnosis not present

## 2016-12-14 DIAGNOSIS — E0590 Thyrotoxicosis, unspecified without thyrotoxic crisis or storm: Secondary | ICD-10-CM | POA: Diagnosis not present

## 2016-12-14 DIAGNOSIS — R51 Headache: Secondary | ICD-10-CM | POA: Diagnosis not present

## 2016-12-14 DIAGNOSIS — J45909 Unspecified asthma, uncomplicated: Secondary | ICD-10-CM | POA: Diagnosis not present

## 2017-03-17 DIAGNOSIS — Z79899 Other long term (current) drug therapy: Secondary | ICD-10-CM | POA: Diagnosis not present

## 2017-03-17 DIAGNOSIS — I639 Cerebral infarction, unspecified: Secondary | ICD-10-CM | POA: Diagnosis not present

## 2017-03-17 DIAGNOSIS — E785 Hyperlipidemia, unspecified: Secondary | ICD-10-CM | POA: Diagnosis not present

## 2017-03-17 DIAGNOSIS — M818 Other osteoporosis without current pathological fracture: Secondary | ICD-10-CM | POA: Diagnosis not present

## 2017-03-17 DIAGNOSIS — M21371 Foot drop, right foot: Secondary | ICD-10-CM | POA: Diagnosis not present

## 2017-08-29 DIAGNOSIS — M549 Dorsalgia, unspecified: Secondary | ICD-10-CM | POA: Diagnosis not present

## 2017-08-29 DIAGNOSIS — K5900 Constipation, unspecified: Secondary | ICD-10-CM | POA: Diagnosis not present

## 2017-08-29 DIAGNOSIS — E0590 Thyrotoxicosis, unspecified without thyrotoxic crisis or storm: Secondary | ICD-10-CM | POA: Diagnosis not present

## 2017-08-29 DIAGNOSIS — J301 Allergic rhinitis due to pollen: Secondary | ICD-10-CM | POA: Diagnosis not present

## 2017-08-29 DIAGNOSIS — M818 Other osteoporosis without current pathological fracture: Secondary | ICD-10-CM | POA: Diagnosis not present

## 2017-08-29 DIAGNOSIS — E785 Hyperlipidemia, unspecified: Secondary | ICD-10-CM | POA: Diagnosis not present

## 2017-08-29 DIAGNOSIS — Z1331 Encounter for screening for depression: Secondary | ICD-10-CM | POA: Diagnosis not present

## 2017-08-29 DIAGNOSIS — Z79899 Other long term (current) drug therapy: Secondary | ICD-10-CM | POA: Diagnosis not present

## 2017-12-18 DIAGNOSIS — Z7982 Long term (current) use of aspirin: Secondary | ICD-10-CM | POA: Diagnosis not present

## 2017-12-18 DIAGNOSIS — I10 Essential (primary) hypertension: Secondary | ICD-10-CM | POA: Diagnosis not present

## 2017-12-18 DIAGNOSIS — M818 Other osteoporosis without current pathological fracture: Secondary | ICD-10-CM | POA: Diagnosis not present

## 2017-12-18 DIAGNOSIS — M21371 Foot drop, right foot: Secondary | ICD-10-CM | POA: Diagnosis not present

## 2017-12-18 DIAGNOSIS — I69351 Hemiplegia and hemiparesis following cerebral infarction affecting right dominant side: Secondary | ICD-10-CM | POA: Diagnosis not present

## 2017-12-18 DIAGNOSIS — E785 Hyperlipidemia, unspecified: Secondary | ICD-10-CM | POA: Diagnosis not present

## 2017-12-18 DIAGNOSIS — J45909 Unspecified asthma, uncomplicated: Secondary | ICD-10-CM | POA: Diagnosis not present

## 2017-12-18 DIAGNOSIS — Z9181 History of falling: Secondary | ICD-10-CM | POA: Diagnosis not present

## 2017-12-18 DIAGNOSIS — E0590 Thyrotoxicosis, unspecified without thyrotoxic crisis or storm: Secondary | ICD-10-CM | POA: Diagnosis not present

## 2017-12-18 DIAGNOSIS — Z7951 Long term (current) use of inhaled steroids: Secondary | ICD-10-CM | POA: Diagnosis not present

## 2017-12-18 DIAGNOSIS — M24561 Contracture, right knee: Secondary | ICD-10-CM | POA: Diagnosis not present

## 2017-12-18 DIAGNOSIS — G8929 Other chronic pain: Secondary | ICD-10-CM | POA: Diagnosis not present

## 2017-12-18 DIAGNOSIS — M545 Low back pain: Secondary | ICD-10-CM | POA: Diagnosis not present

## 2017-12-22 DIAGNOSIS — J45909 Unspecified asthma, uncomplicated: Secondary | ICD-10-CM | POA: Diagnosis not present

## 2017-12-22 DIAGNOSIS — M21371 Foot drop, right foot: Secondary | ICD-10-CM | POA: Diagnosis not present

## 2017-12-22 DIAGNOSIS — M24561 Contracture, right knee: Secondary | ICD-10-CM | POA: Diagnosis not present

## 2017-12-22 DIAGNOSIS — Z7982 Long term (current) use of aspirin: Secondary | ICD-10-CM | POA: Diagnosis not present

## 2017-12-22 DIAGNOSIS — I10 Essential (primary) hypertension: Secondary | ICD-10-CM | POA: Diagnosis not present

## 2017-12-22 DIAGNOSIS — Z9181 History of falling: Secondary | ICD-10-CM | POA: Diagnosis not present

## 2017-12-22 DIAGNOSIS — E0590 Thyrotoxicosis, unspecified without thyrotoxic crisis or storm: Secondary | ICD-10-CM | POA: Diagnosis not present

## 2017-12-22 DIAGNOSIS — M818 Other osteoporosis without current pathological fracture: Secondary | ICD-10-CM | POA: Diagnosis not present

## 2017-12-22 DIAGNOSIS — M545 Low back pain: Secondary | ICD-10-CM | POA: Diagnosis not present

## 2017-12-22 DIAGNOSIS — E785 Hyperlipidemia, unspecified: Secondary | ICD-10-CM | POA: Diagnosis not present

## 2017-12-22 DIAGNOSIS — I69351 Hemiplegia and hemiparesis following cerebral infarction affecting right dominant side: Secondary | ICD-10-CM | POA: Diagnosis not present

## 2017-12-22 DIAGNOSIS — G8929 Other chronic pain: Secondary | ICD-10-CM | POA: Diagnosis not present

## 2017-12-22 DIAGNOSIS — Z7951 Long term (current) use of inhaled steroids: Secondary | ICD-10-CM | POA: Diagnosis not present

## 2017-12-25 DIAGNOSIS — M24561 Contracture, right knee: Secondary | ICD-10-CM | POA: Diagnosis not present

## 2017-12-25 DIAGNOSIS — E785 Hyperlipidemia, unspecified: Secondary | ICD-10-CM | POA: Diagnosis not present

## 2017-12-25 DIAGNOSIS — M818 Other osteoporosis without current pathological fracture: Secondary | ICD-10-CM | POA: Diagnosis not present

## 2017-12-25 DIAGNOSIS — Z9181 History of falling: Secondary | ICD-10-CM | POA: Diagnosis not present

## 2017-12-25 DIAGNOSIS — I10 Essential (primary) hypertension: Secondary | ICD-10-CM | POA: Diagnosis not present

## 2017-12-25 DIAGNOSIS — M545 Low back pain: Secondary | ICD-10-CM | POA: Diagnosis not present

## 2017-12-25 DIAGNOSIS — I69351 Hemiplegia and hemiparesis following cerebral infarction affecting right dominant side: Secondary | ICD-10-CM | POA: Diagnosis not present

## 2017-12-25 DIAGNOSIS — M21371 Foot drop, right foot: Secondary | ICD-10-CM | POA: Diagnosis not present

## 2017-12-25 DIAGNOSIS — J45909 Unspecified asthma, uncomplicated: Secondary | ICD-10-CM | POA: Diagnosis not present

## 2017-12-25 DIAGNOSIS — E0590 Thyrotoxicosis, unspecified without thyrotoxic crisis or storm: Secondary | ICD-10-CM | POA: Diagnosis not present

## 2017-12-25 DIAGNOSIS — Z7951 Long term (current) use of inhaled steroids: Secondary | ICD-10-CM | POA: Diagnosis not present

## 2017-12-25 DIAGNOSIS — Z7982 Long term (current) use of aspirin: Secondary | ICD-10-CM | POA: Diagnosis not present

## 2017-12-25 DIAGNOSIS — G8929 Other chronic pain: Secondary | ICD-10-CM | POA: Diagnosis not present

## 2017-12-26 DIAGNOSIS — I69359 Hemiplegia and hemiparesis following cerebral infarction affecting unspecified side: Secondary | ICD-10-CM | POA: Diagnosis not present

## 2017-12-26 DIAGNOSIS — M549 Dorsalgia, unspecified: Secondary | ICD-10-CM | POA: Diagnosis not present

## 2017-12-26 DIAGNOSIS — L237 Allergic contact dermatitis due to plants, except food: Secondary | ICD-10-CM | POA: Diagnosis not present

## 2017-12-28 DIAGNOSIS — Z7982 Long term (current) use of aspirin: Secondary | ICD-10-CM | POA: Diagnosis not present

## 2017-12-28 DIAGNOSIS — M545 Low back pain: Secondary | ICD-10-CM | POA: Diagnosis not present

## 2017-12-28 DIAGNOSIS — G8929 Other chronic pain: Secondary | ICD-10-CM | POA: Diagnosis not present

## 2017-12-28 DIAGNOSIS — M24561 Contracture, right knee: Secondary | ICD-10-CM | POA: Diagnosis not present

## 2017-12-28 DIAGNOSIS — E0590 Thyrotoxicosis, unspecified without thyrotoxic crisis or storm: Secondary | ICD-10-CM | POA: Diagnosis not present

## 2017-12-28 DIAGNOSIS — I69351 Hemiplegia and hemiparesis following cerebral infarction affecting right dominant side: Secondary | ICD-10-CM | POA: Diagnosis not present

## 2017-12-28 DIAGNOSIS — M818 Other osteoporosis without current pathological fracture: Secondary | ICD-10-CM | POA: Diagnosis not present

## 2017-12-28 DIAGNOSIS — Z9181 History of falling: Secondary | ICD-10-CM | POA: Diagnosis not present

## 2017-12-28 DIAGNOSIS — Z7951 Long term (current) use of inhaled steroids: Secondary | ICD-10-CM | POA: Diagnosis not present

## 2017-12-28 DIAGNOSIS — E785 Hyperlipidemia, unspecified: Secondary | ICD-10-CM | POA: Diagnosis not present

## 2017-12-28 DIAGNOSIS — I10 Essential (primary) hypertension: Secondary | ICD-10-CM | POA: Diagnosis not present

## 2017-12-28 DIAGNOSIS — M21371 Foot drop, right foot: Secondary | ICD-10-CM | POA: Diagnosis not present

## 2017-12-28 DIAGNOSIS — J45909 Unspecified asthma, uncomplicated: Secondary | ICD-10-CM | POA: Diagnosis not present

## 2018-01-01 DIAGNOSIS — Z7982 Long term (current) use of aspirin: Secondary | ICD-10-CM | POA: Diagnosis not present

## 2018-01-01 DIAGNOSIS — M549 Dorsalgia, unspecified: Secondary | ICD-10-CM | POA: Diagnosis not present

## 2018-01-01 DIAGNOSIS — Z7951 Long term (current) use of inhaled steroids: Secondary | ICD-10-CM | POA: Diagnosis not present

## 2018-01-01 DIAGNOSIS — I10 Essential (primary) hypertension: Secondary | ICD-10-CM | POA: Diagnosis not present

## 2018-01-01 DIAGNOSIS — E785 Hyperlipidemia, unspecified: Secondary | ICD-10-CM | POA: Diagnosis not present

## 2018-01-01 DIAGNOSIS — G8929 Other chronic pain: Secondary | ICD-10-CM | POA: Diagnosis not present

## 2018-01-01 DIAGNOSIS — J45909 Unspecified asthma, uncomplicated: Secondary | ICD-10-CM | POA: Diagnosis not present

## 2018-01-01 DIAGNOSIS — I69351 Hemiplegia and hemiparesis following cerebral infarction affecting right dominant side: Secondary | ICD-10-CM | POA: Diagnosis not present

## 2018-01-01 DIAGNOSIS — M24561 Contracture, right knee: Secondary | ICD-10-CM | POA: Diagnosis not present

## 2018-01-01 DIAGNOSIS — Z9181 History of falling: Secondary | ICD-10-CM | POA: Diagnosis not present

## 2018-01-01 DIAGNOSIS — M21371 Foot drop, right foot: Secondary | ICD-10-CM | POA: Diagnosis not present

## 2018-01-01 DIAGNOSIS — E0590 Thyrotoxicosis, unspecified without thyrotoxic crisis or storm: Secondary | ICD-10-CM | POA: Diagnosis not present

## 2018-01-01 DIAGNOSIS — M545 Low back pain: Secondary | ICD-10-CM | POA: Diagnosis not present

## 2018-01-01 DIAGNOSIS — M818 Other osteoporosis without current pathological fracture: Secondary | ICD-10-CM | POA: Diagnosis not present

## 2018-01-02 DIAGNOSIS — Z7951 Long term (current) use of inhaled steroids: Secondary | ICD-10-CM | POA: Diagnosis not present

## 2018-01-02 DIAGNOSIS — I69351 Hemiplegia and hemiparesis following cerebral infarction affecting right dominant side: Secondary | ICD-10-CM | POA: Diagnosis not present

## 2018-01-02 DIAGNOSIS — G8929 Other chronic pain: Secondary | ICD-10-CM | POA: Diagnosis not present

## 2018-01-02 DIAGNOSIS — E0590 Thyrotoxicosis, unspecified without thyrotoxic crisis or storm: Secondary | ICD-10-CM | POA: Diagnosis not present

## 2018-01-02 DIAGNOSIS — M21371 Foot drop, right foot: Secondary | ICD-10-CM | POA: Diagnosis not present

## 2018-01-02 DIAGNOSIS — Z7982 Long term (current) use of aspirin: Secondary | ICD-10-CM | POA: Diagnosis not present

## 2018-01-02 DIAGNOSIS — M24561 Contracture, right knee: Secondary | ICD-10-CM | POA: Diagnosis not present

## 2018-01-02 DIAGNOSIS — E785 Hyperlipidemia, unspecified: Secondary | ICD-10-CM | POA: Diagnosis not present

## 2018-01-02 DIAGNOSIS — I10 Essential (primary) hypertension: Secondary | ICD-10-CM | POA: Diagnosis not present

## 2018-01-02 DIAGNOSIS — Z9181 History of falling: Secondary | ICD-10-CM | POA: Diagnosis not present

## 2018-01-02 DIAGNOSIS — M545 Low back pain: Secondary | ICD-10-CM | POA: Diagnosis not present

## 2018-01-02 DIAGNOSIS — J45909 Unspecified asthma, uncomplicated: Secondary | ICD-10-CM | POA: Diagnosis not present

## 2018-01-02 DIAGNOSIS — M818 Other osteoporosis without current pathological fracture: Secondary | ICD-10-CM | POA: Diagnosis not present

## 2018-01-03 DIAGNOSIS — M545 Low back pain: Secondary | ICD-10-CM | POA: Diagnosis not present

## 2018-01-03 DIAGNOSIS — I69351 Hemiplegia and hemiparesis following cerebral infarction affecting right dominant side: Secondary | ICD-10-CM | POA: Diagnosis not present

## 2018-01-03 DIAGNOSIS — I10 Essential (primary) hypertension: Secondary | ICD-10-CM | POA: Diagnosis not present

## 2018-01-03 DIAGNOSIS — E785 Hyperlipidemia, unspecified: Secondary | ICD-10-CM | POA: Diagnosis not present

## 2018-01-03 DIAGNOSIS — M818 Other osteoporosis without current pathological fracture: Secondary | ICD-10-CM | POA: Diagnosis not present

## 2018-01-03 DIAGNOSIS — G8929 Other chronic pain: Secondary | ICD-10-CM | POA: Diagnosis not present

## 2018-01-03 DIAGNOSIS — Z7951 Long term (current) use of inhaled steroids: Secondary | ICD-10-CM | POA: Diagnosis not present

## 2018-01-03 DIAGNOSIS — M21371 Foot drop, right foot: Secondary | ICD-10-CM | POA: Diagnosis not present

## 2018-01-03 DIAGNOSIS — E0590 Thyrotoxicosis, unspecified without thyrotoxic crisis or storm: Secondary | ICD-10-CM | POA: Diagnosis not present

## 2018-01-03 DIAGNOSIS — Z7982 Long term (current) use of aspirin: Secondary | ICD-10-CM | POA: Diagnosis not present

## 2018-01-03 DIAGNOSIS — M24561 Contracture, right knee: Secondary | ICD-10-CM | POA: Diagnosis not present

## 2018-01-03 DIAGNOSIS — Z9181 History of falling: Secondary | ICD-10-CM | POA: Diagnosis not present

## 2018-01-03 DIAGNOSIS — J45909 Unspecified asthma, uncomplicated: Secondary | ICD-10-CM | POA: Diagnosis not present

## 2018-01-04 DIAGNOSIS — M818 Other osteoporosis without current pathological fracture: Secondary | ICD-10-CM | POA: Diagnosis not present

## 2018-01-04 DIAGNOSIS — E785 Hyperlipidemia, unspecified: Secondary | ICD-10-CM | POA: Diagnosis not present

## 2018-01-04 DIAGNOSIS — E0590 Thyrotoxicosis, unspecified without thyrotoxic crisis or storm: Secondary | ICD-10-CM | POA: Diagnosis not present

## 2018-01-04 DIAGNOSIS — M549 Dorsalgia, unspecified: Secondary | ICD-10-CM | POA: Diagnosis not present

## 2018-01-04 DIAGNOSIS — J301 Allergic rhinitis due to pollen: Secondary | ICD-10-CM | POA: Diagnosis not present

## 2018-01-04 DIAGNOSIS — Z79899 Other long term (current) drug therapy: Secondary | ICD-10-CM | POA: Diagnosis not present

## 2018-01-04 DIAGNOSIS — I69359 Hemiplegia and hemiparesis following cerebral infarction affecting unspecified side: Secondary | ICD-10-CM | POA: Diagnosis not present

## 2018-01-05 DIAGNOSIS — Z9181 History of falling: Secondary | ICD-10-CM | POA: Diagnosis not present

## 2018-01-05 DIAGNOSIS — I69351 Hemiplegia and hemiparesis following cerebral infarction affecting right dominant side: Secondary | ICD-10-CM | POA: Diagnosis not present

## 2018-01-05 DIAGNOSIS — M21371 Foot drop, right foot: Secondary | ICD-10-CM | POA: Diagnosis not present

## 2018-01-05 DIAGNOSIS — E0590 Thyrotoxicosis, unspecified without thyrotoxic crisis or storm: Secondary | ICD-10-CM | POA: Diagnosis not present

## 2018-01-05 DIAGNOSIS — M24561 Contracture, right knee: Secondary | ICD-10-CM | POA: Diagnosis not present

## 2018-01-05 DIAGNOSIS — M818 Other osteoporosis without current pathological fracture: Secondary | ICD-10-CM | POA: Diagnosis not present

## 2018-01-05 DIAGNOSIS — M545 Low back pain: Secondary | ICD-10-CM | POA: Diagnosis not present

## 2018-01-05 DIAGNOSIS — E785 Hyperlipidemia, unspecified: Secondary | ICD-10-CM | POA: Diagnosis not present

## 2018-01-05 DIAGNOSIS — J45909 Unspecified asthma, uncomplicated: Secondary | ICD-10-CM | POA: Diagnosis not present

## 2018-01-05 DIAGNOSIS — Z7951 Long term (current) use of inhaled steroids: Secondary | ICD-10-CM | POA: Diagnosis not present

## 2018-01-05 DIAGNOSIS — G8929 Other chronic pain: Secondary | ICD-10-CM | POA: Diagnosis not present

## 2018-01-05 DIAGNOSIS — I10 Essential (primary) hypertension: Secondary | ICD-10-CM | POA: Diagnosis not present

## 2018-01-05 DIAGNOSIS — Z7982 Long term (current) use of aspirin: Secondary | ICD-10-CM | POA: Diagnosis not present

## 2018-01-06 DIAGNOSIS — S0993XA Unspecified injury of face, initial encounter: Secondary | ICD-10-CM | POA: Diagnosis not present

## 2018-01-06 DIAGNOSIS — S3992XA Unspecified injury of lower back, initial encounter: Secondary | ICD-10-CM | POA: Diagnosis not present

## 2018-01-06 DIAGNOSIS — R51 Headache: Secondary | ICD-10-CM | POA: Diagnosis not present

## 2018-01-06 DIAGNOSIS — R220 Localized swelling, mass and lump, head: Secondary | ICD-10-CM | POA: Diagnosis not present

## 2018-01-06 DIAGNOSIS — S0012XA Contusion of left eyelid and periocular area, initial encounter: Secondary | ICD-10-CM | POA: Diagnosis not present

## 2018-01-06 DIAGNOSIS — S299XXA Unspecified injury of thorax, initial encounter: Secondary | ICD-10-CM | POA: Diagnosis not present

## 2018-01-06 DIAGNOSIS — M549 Dorsalgia, unspecified: Secondary | ICD-10-CM | POA: Diagnosis not present

## 2018-01-06 DIAGNOSIS — K029 Dental caries, unspecified: Secondary | ICD-10-CM | POA: Diagnosis not present

## 2018-01-06 DIAGNOSIS — S40021A Contusion of right upper arm, initial encounter: Secondary | ICD-10-CM | POA: Diagnosis not present

## 2018-01-06 DIAGNOSIS — M79621 Pain in right upper arm: Secondary | ICD-10-CM | POA: Diagnosis not present

## 2018-01-06 DIAGNOSIS — S20229A Contusion of unspecified back wall of thorax, initial encounter: Secondary | ICD-10-CM | POA: Diagnosis not present

## 2018-01-18 DIAGNOSIS — J45909 Unspecified asthma, uncomplicated: Secondary | ICD-10-CM | POA: Diagnosis not present

## 2018-01-18 DIAGNOSIS — G8929 Other chronic pain: Secondary | ICD-10-CM | POA: Diagnosis not present

## 2018-01-18 DIAGNOSIS — M818 Other osteoporosis without current pathological fracture: Secondary | ICD-10-CM | POA: Diagnosis not present

## 2018-01-18 DIAGNOSIS — E785 Hyperlipidemia, unspecified: Secondary | ICD-10-CM | POA: Diagnosis not present

## 2018-01-18 DIAGNOSIS — Z7951 Long term (current) use of inhaled steroids: Secondary | ICD-10-CM | POA: Diagnosis not present

## 2018-01-18 DIAGNOSIS — Z7982 Long term (current) use of aspirin: Secondary | ICD-10-CM | POA: Diagnosis not present

## 2018-01-18 DIAGNOSIS — I10 Essential (primary) hypertension: Secondary | ICD-10-CM | POA: Diagnosis not present

## 2018-01-18 DIAGNOSIS — E0590 Thyrotoxicosis, unspecified without thyrotoxic crisis or storm: Secondary | ICD-10-CM | POA: Diagnosis not present

## 2018-01-18 DIAGNOSIS — M24561 Contracture, right knee: Secondary | ICD-10-CM | POA: Diagnosis not present

## 2018-01-18 DIAGNOSIS — M21371 Foot drop, right foot: Secondary | ICD-10-CM | POA: Diagnosis not present

## 2018-01-18 DIAGNOSIS — M545 Low back pain: Secondary | ICD-10-CM | POA: Diagnosis not present

## 2018-01-18 DIAGNOSIS — Z9181 History of falling: Secondary | ICD-10-CM | POA: Diagnosis not present

## 2018-01-18 DIAGNOSIS — I69351 Hemiplegia and hemiparesis following cerebral infarction affecting right dominant side: Secondary | ICD-10-CM | POA: Diagnosis not present

## 2018-01-22 DIAGNOSIS — G894 Chronic pain syndrome: Secondary | ICD-10-CM | POA: Diagnosis not present

## 2018-01-22 DIAGNOSIS — M5136 Other intervertebral disc degeneration, lumbar region: Secondary | ICD-10-CM | POA: Diagnosis not present

## 2018-01-22 DIAGNOSIS — Z720 Tobacco use: Secondary | ICD-10-CM | POA: Diagnosis not present

## 2018-01-31 DIAGNOSIS — Z720 Tobacco use: Secondary | ICD-10-CM | POA: Diagnosis not present

## 2018-01-31 DIAGNOSIS — M5136 Other intervertebral disc degeneration, lumbar region: Secondary | ICD-10-CM | POA: Diagnosis not present

## 2018-03-19 DIAGNOSIS — Z720 Tobacco use: Secondary | ICD-10-CM | POA: Diagnosis not present

## 2018-03-19 DIAGNOSIS — G894 Chronic pain syndrome: Secondary | ICD-10-CM | POA: Diagnosis not present

## 2018-03-19 DIAGNOSIS — M5136 Other intervertebral disc degeneration, lumbar region: Secondary | ICD-10-CM | POA: Diagnosis not present

## 2018-04-04 DIAGNOSIS — M5416 Radiculopathy, lumbar region: Secondary | ICD-10-CM | POA: Diagnosis not present

## 2018-04-04 DIAGNOSIS — Z720 Tobacco use: Secondary | ICD-10-CM | POA: Diagnosis not present

## 2018-04-19 DIAGNOSIS — G894 Chronic pain syndrome: Secondary | ICD-10-CM | POA: Diagnosis not present

## 2018-04-19 DIAGNOSIS — M5136 Other intervertebral disc degeneration, lumbar region: Secondary | ICD-10-CM | POA: Diagnosis not present

## 2018-04-19 DIAGNOSIS — G890 Central pain syndrome: Secondary | ICD-10-CM | POA: Diagnosis not present

## 2018-04-19 DIAGNOSIS — Z720 Tobacco use: Secondary | ICD-10-CM | POA: Diagnosis not present

## 2018-06-13 DIAGNOSIS — Z720 Tobacco use: Secondary | ICD-10-CM | POA: Diagnosis not present

## 2018-06-13 DIAGNOSIS — M5136 Other intervertebral disc degeneration, lumbar region: Secondary | ICD-10-CM | POA: Diagnosis not present

## 2018-06-20 DIAGNOSIS — M5136 Other intervertebral disc degeneration, lumbar region: Secondary | ICD-10-CM | POA: Diagnosis not present

## 2018-06-20 DIAGNOSIS — G894 Chronic pain syndrome: Secondary | ICD-10-CM | POA: Diagnosis not present

## 2018-06-20 DIAGNOSIS — G890 Central pain syndrome: Secondary | ICD-10-CM | POA: Diagnosis not present

## 2018-06-20 DIAGNOSIS — Z720 Tobacco use: Secondary | ICD-10-CM | POA: Diagnosis not present

## 2018-07-06 DIAGNOSIS — M5136 Other intervertebral disc degeneration, lumbar region: Secondary | ICD-10-CM | POA: Diagnosis not present

## 2018-07-06 DIAGNOSIS — M4807 Spinal stenosis, lumbosacral region: Secondary | ICD-10-CM | POA: Diagnosis not present

## 2018-07-06 DIAGNOSIS — M5126 Other intervertebral disc displacement, lumbar region: Secondary | ICD-10-CM | POA: Diagnosis not present

## 2018-07-27 DIAGNOSIS — M5136 Other intervertebral disc degeneration, lumbar region: Secondary | ICD-10-CM | POA: Diagnosis not present

## 2018-07-27 DIAGNOSIS — G890 Central pain syndrome: Secondary | ICD-10-CM | POA: Diagnosis not present

## 2018-07-27 DIAGNOSIS — M47816 Spondylosis without myelopathy or radiculopathy, lumbar region: Secondary | ICD-10-CM | POA: Diagnosis not present

## 2018-07-27 DIAGNOSIS — G894 Chronic pain syndrome: Secondary | ICD-10-CM | POA: Diagnosis not present

## 2018-09-04 DIAGNOSIS — Z79899 Other long term (current) drug therapy: Secondary | ICD-10-CM | POA: Diagnosis not present

## 2018-09-04 DIAGNOSIS — Z720 Tobacco use: Secondary | ICD-10-CM | POA: Diagnosis not present

## 2018-09-04 DIAGNOSIS — G894 Chronic pain syndrome: Secondary | ICD-10-CM | POA: Diagnosis not present

## 2018-10-25 DIAGNOSIS — I69359 Hemiplegia and hemiparesis following cerebral infarction affecting unspecified side: Secondary | ICD-10-CM | POA: Diagnosis not present

## 2018-10-25 DIAGNOSIS — G4700 Insomnia, unspecified: Secondary | ICD-10-CM | POA: Diagnosis not present

## 2018-10-25 DIAGNOSIS — E0590 Thyrotoxicosis, unspecified without thyrotoxic crisis or storm: Secondary | ICD-10-CM | POA: Diagnosis not present

## 2018-10-25 DIAGNOSIS — N399 Disorder of urinary system, unspecified: Secondary | ICD-10-CM | POA: Diagnosis not present

## 2018-10-25 DIAGNOSIS — E785 Hyperlipidemia, unspecified: Secondary | ICD-10-CM | POA: Diagnosis not present

## 2019-01-28 DIAGNOSIS — E785 Hyperlipidemia, unspecified: Secondary | ICD-10-CM | POA: Diagnosis not present

## 2019-01-28 DIAGNOSIS — I69359 Hemiplegia and hemiparesis following cerebral infarction affecting unspecified side: Secondary | ICD-10-CM | POA: Diagnosis not present

## 2019-01-28 DIAGNOSIS — E0590 Thyrotoxicosis, unspecified without thyrotoxic crisis or storm: Secondary | ICD-10-CM | POA: Diagnosis not present

## 2019-01-28 DIAGNOSIS — K5900 Constipation, unspecified: Secondary | ICD-10-CM | POA: Diagnosis not present

## 2019-08-20 DIAGNOSIS — S161XXA Strain of muscle, fascia and tendon at neck level, initial encounter: Secondary | ICD-10-CM | POA: Diagnosis not present

## 2019-08-20 DIAGNOSIS — L0231 Cutaneous abscess of buttock: Secondary | ICD-10-CM | POA: Diagnosis not present

## 2019-12-02 DIAGNOSIS — L0231 Cutaneous abscess of buttock: Secondary | ICD-10-CM | POA: Diagnosis not present

## 2020-04-07 DIAGNOSIS — M818 Other osteoporosis without current pathological fracture: Secondary | ICD-10-CM | POA: Diagnosis not present

## 2020-04-07 DIAGNOSIS — E0590 Thyrotoxicosis, unspecified without thyrotoxic crisis or storm: Secondary | ICD-10-CM | POA: Diagnosis not present

## 2020-04-07 DIAGNOSIS — E785 Hyperlipidemia, unspecified: Secondary | ICD-10-CM | POA: Diagnosis not present

## 2021-05-14 DIAGNOSIS — K5909 Other constipation: Secondary | ICD-10-CM | POA: Diagnosis not present

## 2021-05-14 DIAGNOSIS — K219 Gastro-esophageal reflux disease without esophagitis: Secondary | ICD-10-CM | POA: Diagnosis not present

## 2021-05-14 DIAGNOSIS — E782 Mixed hyperlipidemia: Secondary | ICD-10-CM | POA: Diagnosis not present

## 2021-05-14 DIAGNOSIS — I69351 Hemiplegia and hemiparesis following cerebral infarction affecting right dominant side: Secondary | ICD-10-CM | POA: Diagnosis not present

## 2021-11-12 DIAGNOSIS — K5909 Other constipation: Secondary | ICD-10-CM | POA: Diagnosis not present

## 2021-11-12 DIAGNOSIS — E782 Mixed hyperlipidemia: Secondary | ICD-10-CM | POA: Diagnosis not present

## 2021-11-12 DIAGNOSIS — K219 Gastro-esophageal reflux disease without esophagitis: Secondary | ICD-10-CM | POA: Diagnosis not present

## 2021-11-12 DIAGNOSIS — E559 Vitamin D deficiency, unspecified: Secondary | ICD-10-CM | POA: Diagnosis not present

## 2021-11-12 DIAGNOSIS — I69351 Hemiplegia and hemiparesis following cerebral infarction affecting right dominant side: Secondary | ICD-10-CM | POA: Diagnosis not present

## 2022-06-30 DIAGNOSIS — K219 Gastro-esophageal reflux disease without esophagitis: Secondary | ICD-10-CM | POA: Diagnosis not present

## 2022-06-30 DIAGNOSIS — K5909 Other constipation: Secondary | ICD-10-CM | POA: Diagnosis not present

## 2022-06-30 DIAGNOSIS — J218 Acute bronchiolitis due to other specified organisms: Secondary | ICD-10-CM | POA: Diagnosis not present

## 2022-06-30 DIAGNOSIS — E782 Mixed hyperlipidemia: Secondary | ICD-10-CM | POA: Diagnosis not present

## 2022-06-30 DIAGNOSIS — E559 Vitamin D deficiency, unspecified: Secondary | ICD-10-CM | POA: Diagnosis not present

## 2022-06-30 DIAGNOSIS — I69351 Hemiplegia and hemiparesis following cerebral infarction affecting right dominant side: Secondary | ICD-10-CM | POA: Diagnosis not present

## 2022-07-01 ENCOUNTER — Encounter (HOSPITAL_COMMUNITY): Payer: Self-pay

## 2022-07-01 DIAGNOSIS — R911 Solitary pulmonary nodule: Secondary | ICD-10-CM | POA: Diagnosis not present

## 2022-07-01 DIAGNOSIS — R591 Generalized enlarged lymph nodes: Secondary | ICD-10-CM | POA: Diagnosis not present

## 2022-07-01 DIAGNOSIS — R059 Cough, unspecified: Secondary | ICD-10-CM | POA: Diagnosis not present

## 2022-07-01 DIAGNOSIS — R000 Tachycardia, unspecified: Secondary | ICD-10-CM | POA: Diagnosis not present

## 2022-07-01 DIAGNOSIS — R509 Fever, unspecified: Secondary | ICD-10-CM | POA: Diagnosis not present

## 2022-07-01 DIAGNOSIS — E871 Hypo-osmolality and hyponatremia: Secondary | ICD-10-CM | POA: Diagnosis not present

## 2022-07-01 DIAGNOSIS — R0600 Dyspnea, unspecified: Secondary | ICD-10-CM | POA: Diagnosis not present

## 2022-07-01 DIAGNOSIS — D649 Anemia, unspecified: Secondary | ICD-10-CM | POA: Diagnosis not present

## 2022-07-01 DIAGNOSIS — D6859 Other primary thrombophilia: Secondary | ICD-10-CM | POA: Diagnosis not present

## 2022-07-01 DIAGNOSIS — F17210 Nicotine dependence, cigarettes, uncomplicated: Secondary | ICD-10-CM | POA: Diagnosis not present

## 2022-07-01 DIAGNOSIS — A419 Sepsis, unspecified organism: Secondary | ICD-10-CM | POA: Diagnosis not present

## 2022-07-01 DIAGNOSIS — J984 Other disorders of lung: Secondary | ICD-10-CM | POA: Diagnosis not present

## 2022-07-01 DIAGNOSIS — R9431 Abnormal electrocardiogram [ECG] [EKG]: Secondary | ICD-10-CM | POA: Diagnosis not present

## 2022-07-01 DIAGNOSIS — J869 Pyothorax without fistula: Secondary | ICD-10-CM | POA: Diagnosis not present

## 2022-07-01 DIAGNOSIS — J90 Pleural effusion, not elsewhere classified: Secondary | ICD-10-CM | POA: Diagnosis not present

## 2022-07-02 ENCOUNTER — Inpatient Hospital Stay (HOSPITAL_COMMUNITY)
Admission: RE | Admit: 2022-07-02 | Discharge: 2022-07-09 | DRG: 871 | Disposition: A | Discharge status: Home / Self Care | Payer: 59 | Source: Other Acute Inpatient Hospital | Attending: Internal Medicine | Admitting: Internal Medicine

## 2022-07-02 ENCOUNTER — Encounter (HOSPITAL_COMMUNITY): Payer: Self-pay | Admitting: Internal Medicine

## 2022-07-02 DIAGNOSIS — D638 Anemia in other chronic diseases classified elsewhere: Secondary | ICD-10-CM | POA: Diagnosis present

## 2022-07-02 DIAGNOSIS — R7612 Nonspecific reaction to cell mediated immunity measurement of gamma interferon antigen response without active tuberculosis: Secondary | ICD-10-CM | POA: Diagnosis present

## 2022-07-02 DIAGNOSIS — Z7982 Long term (current) use of aspirin: Secondary | ICD-10-CM

## 2022-07-02 DIAGNOSIS — J869 Pyothorax without fistula: Secondary | ICD-10-CM | POA: Diagnosis present

## 2022-07-02 DIAGNOSIS — R911 Solitary pulmonary nodule: Secondary | ICD-10-CM | POA: Diagnosis not present

## 2022-07-02 DIAGNOSIS — Z79899 Other long term (current) drug therapy: Secondary | ICD-10-CM | POA: Diagnosis not present

## 2022-07-02 DIAGNOSIS — Z720 Tobacco use: Secondary | ICD-10-CM | POA: Diagnosis not present

## 2022-07-02 DIAGNOSIS — Z818 Family history of other mental and behavioral disorders: Secondary | ICD-10-CM

## 2022-07-02 DIAGNOSIS — J939 Pneumothorax, unspecified: Secondary | ICD-10-CM | POA: Diagnosis not present

## 2022-07-02 DIAGNOSIS — D509 Iron deficiency anemia, unspecified: Secondary | ICD-10-CM | POA: Diagnosis present

## 2022-07-02 DIAGNOSIS — Z4682 Encounter for fitting and adjustment of non-vascular catheter: Secondary | ICD-10-CM | POA: Diagnosis not present

## 2022-07-02 DIAGNOSIS — R7881 Bacteremia: Secondary | ICD-10-CM | POA: Diagnosis not present

## 2022-07-02 DIAGNOSIS — J158 Pneumonia due to other specified bacteria: Secondary | ICD-10-CM | POA: Diagnosis present

## 2022-07-02 DIAGNOSIS — Z880 Allergy status to penicillin: Secondary | ICD-10-CM | POA: Diagnosis not present

## 2022-07-02 DIAGNOSIS — D75838 Other thrombocytosis: Secondary | ICD-10-CM | POA: Diagnosis present

## 2022-07-02 DIAGNOSIS — F17210 Nicotine dependence, cigarettes, uncomplicated: Secondary | ICD-10-CM | POA: Diagnosis present

## 2022-07-02 DIAGNOSIS — F1210 Cannabis abuse, uncomplicated: Secondary | ICD-10-CM | POA: Diagnosis present

## 2022-07-02 DIAGNOSIS — Z833 Family history of diabetes mellitus: Secondary | ICD-10-CM | POA: Diagnosis not present

## 2022-07-02 DIAGNOSIS — J95811 Postprocedural pneumothorax: Secondary | ICD-10-CM | POA: Diagnosis not present

## 2022-07-02 DIAGNOSIS — J948 Other specified pleural conditions: Secondary | ICD-10-CM | POA: Diagnosis not present

## 2022-07-02 DIAGNOSIS — A419 Sepsis, unspecified organism: Secondary | ICD-10-CM | POA: Diagnosis present

## 2022-07-02 DIAGNOSIS — J984 Other disorders of lung: Secondary | ICD-10-CM | POA: Diagnosis not present

## 2022-07-02 DIAGNOSIS — J90 Pleural effusion, not elsewhere classified: Secondary | ICD-10-CM | POA: Diagnosis not present

## 2022-07-02 DIAGNOSIS — K029 Dental caries, unspecified: Secondary | ICD-10-CM | POA: Diagnosis present

## 2022-07-02 DIAGNOSIS — Z8261 Family history of arthritis: Secondary | ICD-10-CM | POA: Diagnosis not present

## 2022-07-02 DIAGNOSIS — J45909 Unspecified asthma, uncomplicated: Secondary | ICD-10-CM | POA: Diagnosis not present

## 2022-07-02 DIAGNOSIS — R918 Other nonspecific abnormal finding of lung field: Secondary | ICD-10-CM | POA: Diagnosis not present

## 2022-07-02 DIAGNOSIS — I69351 Hemiplegia and hemiparesis following cerebral infarction affecting right dominant side: Secondary | ICD-10-CM | POA: Diagnosis not present

## 2022-07-02 DIAGNOSIS — B954 Other streptococcus as the cause of diseases classified elsewhere: Secondary | ICD-10-CM | POA: Diagnosis not present

## 2022-07-02 DIAGNOSIS — Z87820 Personal history of traumatic brain injury: Secondary | ICD-10-CM

## 2022-07-02 DIAGNOSIS — Z8661 Personal history of infections of the central nervous system: Secondary | ICD-10-CM | POA: Diagnosis not present

## 2022-07-02 DIAGNOSIS — J439 Emphysema, unspecified: Secondary | ICD-10-CM | POA: Diagnosis not present

## 2022-07-02 DIAGNOSIS — E871 Hypo-osmolality and hyponatremia: Secondary | ICD-10-CM | POA: Diagnosis not present

## 2022-07-02 DIAGNOSIS — D649 Anemia, unspecified: Secondary | ICD-10-CM | POA: Diagnosis not present

## 2022-07-02 DIAGNOSIS — D6859 Other primary thrombophilia: Secondary | ICD-10-CM | POA: Diagnosis not present

## 2022-07-02 LAB — CBC WITH DIFFERENTIAL/PLATELET
Abs Immature Granulocytes: 0.26 10*3/uL — ABNORMAL HIGH (ref 0.00–0.07)
Basophils Absolute: 0.1 10*3/uL (ref 0.0–0.1)
Basophils Relative: 0 %
Eosinophils Absolute: 0.1 10*3/uL (ref 0.0–0.5)
Eosinophils Relative: 0 %
HCT: 26.1 % — ABNORMAL LOW (ref 39.0–52.0)
Hemoglobin: 7.9 g/dL — ABNORMAL LOW (ref 13.0–17.0)
Immature Granulocytes: 1 %
Lymphocytes Relative: 10 %
Lymphs Abs: 2.2 10*3/uL (ref 0.7–4.0)
MCH: 23.7 pg — ABNORMAL LOW (ref 26.0–34.0)
MCHC: 30.3 g/dL (ref 30.0–36.0)
MCV: 78.4 fL — ABNORMAL LOW (ref 80.0–100.0)
Monocytes Absolute: 1.9 10*3/uL — ABNORMAL HIGH (ref 0.1–1.0)
Monocytes Relative: 9 %
Neutro Abs: 16.7 10*3/uL — ABNORMAL HIGH (ref 1.7–7.7)
Neutrophils Relative %: 80 %
Platelets: 724 10*3/uL — ABNORMAL HIGH (ref 150–400)
RBC: 3.33 MIL/uL — ABNORMAL LOW (ref 4.22–5.81)
RDW: 17.2 % — ABNORMAL HIGH (ref 11.5–15.5)
WBC: 21.2 10*3/uL — ABNORMAL HIGH (ref 4.0–10.5)
nRBC: 0 % (ref 0.0–0.2)

## 2022-07-02 LAB — COMPREHENSIVE METABOLIC PANEL
ALT: 12 U/L (ref 0–44)
AST: 13 U/L — ABNORMAL LOW (ref 15–41)
Albumin: 1.9 g/dL — ABNORMAL LOW (ref 3.5–5.0)
Alkaline Phosphatase: 119 U/L (ref 38–126)
Anion gap: 9 (ref 5–15)
BUN: 10 mg/dL (ref 6–20)
CO2: 21 mmol/L — ABNORMAL LOW (ref 22–32)
Calcium: 7.8 mg/dL — ABNORMAL LOW (ref 8.9–10.3)
Chloride: 99 mmol/L (ref 98–111)
Creatinine, Ser: 0.98 mg/dL (ref 0.61–1.24)
GFR, Estimated: >60 mL/min (ref >60)
Glucose, Bld: 99 mg/dL (ref 70–99)
Potassium: 3.5 mmol/L (ref 3.5–5.1)
Sodium: 129 mmol/L — ABNORMAL LOW (ref 135–145)
Total Bilirubin: 0.2 mg/dL — ABNORMAL LOW (ref 0.3–1.2)
Total Protein: 6.7 g/dL (ref 6.5–8.1)

## 2022-07-02 LAB — PROCALCITONIN: Procalcitonin: 0.57 ng/mL

## 2022-07-02 LAB — MRSA NEXT GEN BY PCR, NASAL: MRSA by PCR Next Gen: NOT DETECTED

## 2022-07-02 LAB — LACTATE DEHYDROGENASE: LDH: 106 U/L (ref 98–192)

## 2022-07-02 MED ORDER — IPRATROPIUM-ALBUTEROL 0.5-2.5 (3) MG/3ML IN SOLN: 3.0000 mL | RESPIRATORY_TRACT | Status: DC | PRN

## 2022-07-02 MED ORDER — ONDANSETRON HCL 4 MG/2ML IJ SOLN: 4.0000 mg | Freq: Four times a day (QID) | INTRAMUSCULAR | Status: DC | PRN

## 2022-07-02 MED ORDER — ACETAMINOPHEN 500 MG PO TABS
1000.0000 mg | ORAL_TABLET | Freq: Once | ORAL | Status: AC
  Administered 2022-07-02: 1000 mg via ORAL
  Filled 2022-07-02: qty 2

## 2022-07-02 MED ORDER — PIPERACILLIN-TAZOBACTAM 3.375 G IVPB 30 MIN
3.3750 g | Freq: Once | INTRAVENOUS | Status: AC
  Administered 2022-07-03: 3.375 g via INTRAVENOUS
  Filled 2022-07-02 (×2): qty 50

## 2022-07-02 MED ORDER — ONDANSETRON HCL 4 MG PO TABS: 4.0000 mg | ORAL_TABLET | Freq: Four times a day (QID) | ORAL | Status: DC | PRN

## 2022-07-02 MED ORDER — PIPERACILLIN-TAZOBACTAM 3.375 G IVPB
3.3750 g | Freq: Three times a day (TID) | INTRAVENOUS | Status: DC
  Administered 2022-07-03 – 2022-07-08 (×16): 3.375 g via INTRAVENOUS
  Filled 2022-07-02 (×16): qty 50

## 2022-07-02 MED ORDER — POTASSIUM CHLORIDE IN NACL 40-0.9 MEQ/L-% IV SOLN
INTRAVENOUS | Status: DC
  Filled 2022-07-02 (×2): qty 1000

## 2022-07-02 MED ORDER — GUAIFENESIN-DM 100-10 MG/5ML PO SYRP
15.0000 mL | ORAL_SOLUTION | ORAL | Status: DC | PRN
  Administered 2022-07-06 – 2022-07-07 (×3): 15 mL via ORAL
  Filled 2022-07-02 (×5): qty 15

## 2022-07-02 MED ORDER — LACTATED RINGERS IV SOLN: INTRAVENOUS | Status: DC

## 2022-07-02 MED ORDER — SODIUM CHLORIDE 0.9 % IV SOLN: INTRAVENOUS | Status: DC

## 2022-07-02 MED ORDER — VANCOMYCIN HCL 2000 MG/400ML IV SOLN
2000.0000 mg | Freq: Two times a day (BID) | INTRAVENOUS | Status: DC
  Administered 2022-07-02: 2000 mg via INTRAVENOUS
  Filled 2022-07-02 (×2): qty 400

## 2022-07-02 MED ORDER — ACETAMINOPHEN 650 MG RE SUPP: 650.0000 mg | Freq: Four times a day (QID) | RECTAL | Status: DC | PRN

## 2022-07-02 MED ORDER — ACETAMINOPHEN 325 MG PO TABS
650.0000 mg | ORAL_TABLET | Freq: Four times a day (QID) | ORAL | Status: DC | PRN
  Administered 2022-07-06 – 2022-07-08 (×3): 650 mg via ORAL
  Filled 2022-07-02 (×4): qty 2

## 2022-07-02 NOTE — Plan of Care (Signed)
  Problem: Education: Goal: Knowledge of General Education information will improve Description: Including pain rating scale, medication(s)/side effects and non-pharmacologic comfort measures Outcome: Progressing   Problem: Clinical Measurements: Goal: Cardiovascular complication will be avoided Outcome: Progressing   Problem: Nutrition: Goal: Adequate nutrition will be maintained Outcome: Progressing   

## 2022-07-02 NOTE — Progress Notes (Signed)
Pharmacy Antibiotic Note  [name] is a 37 y.o. male admitted on 07/02/2022 with  empyema .  Pharmacy has been consulted for vanc/zosyn dosing.  Pt presented to [facility] for PNA. CT showed empyema. He was tx here for possible surgical management. He was start on abx there. Vanc/zosyn ordered here. Childhood rash to amox. D/w Dr. Bridgett Larsson and we will cont with zosyn and monitor.   Scr 0.8  Plan: Zosyn 3.375g IV x1 then 3.375g IV q8 Vanc 2g IV q12>>AUC 415, scr 0.8   Height: 6' 3"$  (190.5 cm) Weight: 101.4 kg (223 lb 8.7 oz) IBW/kg (Calculated) : 84.5  Temp (24hrs), Avg:101.4 F (38.6 C), Min:101.4 F (38.6 C), Max:101.4 F (38.6 C)  No results for input(s): "WBC", "CREATININE", "LATICACIDVEN", "VANCOTROUGH", "VANCOPEAK", "VANCORANDOM", "GENTTROUGH", "GENTPEAK", "GENTRANDOM", "TOBRATROUGH", "TOBRAPEAK", "TOBRARND", "AMIKACINPEAK", "AMIKACINTROU", "AMIKACIN" in the last 168 hours.  CrCl cannot be calculated (Patient's most recent lab result is older than the maximum 21 days allowed.).    Allergies  Allergen Reactions   Amoxicillin     Antimicrobials this admission: 2/16 azith x1 2/16 cefepime>>2/17 2/17 fluconazole x1 2/16 flagyl>>2/17 2/16 vanc>>  2/17 zosyn>>  Dose adjustments this admission:   Microbiology results: 2/17 MRSA neg  Onnie Boer, PharmD, BCIDP, AAHIVP, CPP Infectious Disease Pharmacist 07/02/2022 9:37 PM

## 2022-07-02 NOTE — Subjective & Objective (Signed)
CC: empyema HPI:  37 year old male 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  He was seen by his local PCP.  Chest x-ray demonstrated pleural effusion.  And a white count of to 29,000.  Patient referred to the local emergency department.  CT scan performed there showed a large loculated pleural effusion with empyema.  He also had a cavitary lesion.  Transfer requested from [facility] to [facility] for CT surgery evaluation.  Patient has numerous dental caries.  He states that he has been coughing up thick sputum.  Denies any hemoptysis.  On arrival to [facility], temp 101.4 heart rate 124 blood pressure 136/83 satting 95% on room air.  His CT scan from [facility] was reviewed.  He has a cavitary left upper lobe lesion.  He also has a loculated pleural effusion.  Appearance is consistent with empyema.

## 2022-07-02 NOTE — Assessment & Plan Note (Addendum)
Due to cavitary pneumonia and empyema. Has WBC of 26K. Fulfills sepsis criteria. Has fever of 101.4 HR 124. Check CBC, CMP, procal, LDH.

## 2022-07-02 NOTE — Assessment & Plan Note (Addendum)
Chronic. -Nicotine patch as needed

## 2022-07-02 NOTE — Assessment & Plan Note (Signed)
Chronic. Pt states he can walk on right LE. 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

## 2022-07-02 NOTE — Assessment & Plan Note (Addendum)
See above. Will need chest tube and likely intrapleural lytics to help with effusion.

## 2022-07-02 NOTE — H&P (Signed)
History and Physical    [name] [id number] DOB: 07-11-1985 DOA: 07/02/2022  DOS: the patient was seen and examined on 07/02/2022  PCP: [name], MD   Patient coming from:  [facility] ER  I have personally briefly reviewed patient's old medical records in [place]  CC: empyema HPI:  37 year old male 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  He was seen by his local PCP.  Chest x-ray demonstrated pleural effusion.  And a white count of to 29,000.  Patient referred to the local emergency department.  CT scan performed there showed a large loculated pleural effusion with empyema.  He also had a cavitary lesion.  Transfer requested from [facility] to [facility] for CT surgery evaluation.  Patient has numerous dental caries.  He states that he has been coughing up thick sputum.  Denies any hemoptysis.  On arrival to [facility], temp 101.4 heart rate 124 blood pressure 136/83 satting 95% on room air.  His CT scan from [place] was reviewed.  He has a cavitary left upper lobe lesion.  He also has a loculated pleural effusion.  Appearance is consistent with empyema.   ED Course: CT chest showed loculated left sided effusion consistent with empyema. WBC 26K  Review of Systems:  Review of Systems  Constitutional:  Positive for chills, fever and malaise/fatigue.  HENT: Negative.    Eyes: Negative.   Respiratory:  Positive for cough, sputum production and shortness of breath.   Cardiovascular: Negative.   Gastrointestinal: Negative.   Genitourinary: Negative.   Musculoskeletal: Negative.   Skin: Negative.   Neurological:        Chronic right hemiparesis  Endo/Heme/Allergies: Negative.   Psychiatric/Behavioral: Negative.      Past Medical History:  Diagnosis Date    Asthma    Cerebral thrombosis with cerebral infarction ([place])    Headache(784.0)    Spastic hemiplegia affecting dominant side ([place])    Stroke (HCC)    TBI (traumatic brain injury) ([place])     Past Surgical History:  Procedure Laterality Date   ANKLE SURGERY     EYE SURGERY     TONSILECTOMY, ADENOIDECTOMY, BILATERAL MYRINGOTOMY AND TUBES       reports that he has been smoking cigarettes. He uses smokeless tobacco. He reports that he does not currently use alcohol. He reports current drug use. Drug: Marijuana.  Allergies  Allergen Reactions   Amoxicillin     Family History  Problem Relation Age of Onset   Arthritis Mother    Anxiety disorder Mother    Diabetes Father     Prior to Admission medications   Medication Sig Start Date End Date Taking? Authorizing Provider  aspirin 325 MG tablet Take 325 mg by mouth daily.    [provider]  baclofen (LIORESAL) 20 MG tablet TAKE 1 TABLET FOUR TIMES DAILY. 05/17/12   Meredith Staggers, MD  citalopram (CELEXA) 40 MG tablet TAKE 1 TABLET AT BEDTIME. 05/17/12   Meredith Staggers, MD  doxycycline (VIBRAMYCIN) 100 MG capsule Take 100 mg by mouth 2 (two) times daily.    [provider]  ergocalciferol (VITAMIN D2) 50000 UNITS capsule Take 50,000 Units by mouth once a week.    [provider]  gabapentin (NEURONTIN) 300 MG capsule TAKE 1 CAPSULE THREE TIMES DAILY. 05/17/12  Meredith Staggers, MD  nortriptyline (PAMELOR) 50 MG capsule TAKE 1 OR 2 CAPSULES AT BEDTIME. 05/17/12   Meredith Staggers, MD  oxycodone (OXY-IR) 5 MG capsule Take 1 capsule (5 mg total) by mouth every 6 (six) hours as needed. 01/20/12   Meredith Staggers, MD  pravastatin (PRAVACHOL) 40 MG tablet TAKE 1 TABLET AT BEDTIME. 05/17/12   Meredith Staggers, MD  Tamsulosin HCl (FLOMAX) 0.4 MG CAPS Take 1 capsule (0.4 mg total) by mouth daily after supper. 01/20/12   Meredith Staggers, MD  topiramate (TOPAMAX) 100 MG tablet 118m 2 po qhs 04/18/12   SMeredith Staggers  MD    Physical Exam: Vitals:   07/02/22 2007  BP: 136/83  Pulse: (!) 124  Resp: (!) 24  Temp: (!) 101.4 F (38.6 C)  TempSrc: Oral  SpO2: 95%  Weight: 101.4 kg  Height: 6' 3"$  (1.905 m)    Physical Exam Vitals and nursing note reviewed.  Constitutional:      General: He is not in acute distress.    Appearance: He is not toxic-appearing.  HENT:     Head: Normocephalic and atraumatic.     Nose: Nose normal.  Cardiovascular:     Rate and Rhythm: Regular rhythm. Tachycardia present.     Pulses: Normal pulses.  Pulmonary:     Comments: No BS in left base  Right side clear Abdominal:     General: Abdomen is flat. Bowel sounds are normal. There is no distension.     Palpations: Abdomen is soft.     Tenderness: There is no abdominal tenderness.  Musculoskeletal:     Comments: Right UE in flexion contracture Right LE with muscle atrophy. Able to move right LE spontaneously Pt states he is able to ambulate on right LE.  Skin:    General: Skin is warm.     Capillary Refill: Capillary refill takes less than 2 seconds.  Neurological:     Mental Status: He is alert and oriented to person, place, and time.      Labs on Admission: I have personally reviewed following labs and imaging studies  CBC: No results for input(s): "WBC", "NEUTROABS", "HGB", "HCT", "MCV", "PLT" in the last 168 hours. Basic Metabolic Panel: No results for input(s): "NA", "K", "CL", "CO2", "GLUCOSE", "BUN", "CREATININE", "CALCIUM", "MG", "PHOS" in the last 168 hours. GFR: CrCl cannot be calculated (Patient's most recent lab result is older than the maximum 21 days allowed.). Liver Function Tests: No results for input(s): "AST", "ALT", "ALKPHOS", "BILITOT", "PROT", "ALBUMIN" in the last 168 hours. No results for input(s): "LIPASE", "AMYLASE" in the last 168 hours. No results for input(s): "AMMONIA" in the last 168 hours. Coagulation Profile: No results for input(s): "INR", "PROTIME" in the last 168  hours. Cardiac Enzymes: No results for input(s): "CKTOTAL", "CKMB", "CKMBINDEX", "TROPONINI", "TROPONINIHS" in the last 168 hours. BNP (last 3 results) No results for input(s): "PROBNP" in the last 8760 hours. HbA1C: No results for input(s): "HGBA1C" in the last 72 hours. CBG: No results for input(s): "GLUCAP" in the last 168 hours. Lipid Profile: No results for input(s): "CHOL", "HDL", "LDLCALC", "TRIG", "CHOLHDL", "LDLDIRECT" in the last 72 hours. Thyroid Function Tests: No results for input(s): "TSH", "T4TOTAL", "FREET4", "T3FREE", "THYROIDAB" in the last 72 hours. Anemia Panel: No results for input(s): "VITAMINB12", "FOLATE", "FERRITIN", "TIBC", "IRON", "RETICCTPCT" in the last 72 hours. Urine analysis: No results found for: "COLORURINE", "APPEARANCEUR", "LABSPEC", "PHURINE", "GLUCOSEU", "HGBUR", "BILIRUBINUR", "KETONESUR", "PROTEINUR", "UROBILINOGEN", "NITRITE", "LEUKOCYTESUR"  Radiological Exams  on Admission: I have personally reviewed images      EKG: My personal interpretation of EKG shows: none   Assessment/Plan Principal Problem:   Empyema (HCC)  - left side Active Problems:   Loculated pleural effusion   Sepsis without acute organ dysfunction (HCC)   Pulmonary cavitary lesion - left side   Hemiparesis affecting right side as late effect of cerebrovascular accident (CVA) ([place])   Tobacco abuse    Assessment and Plan: * Empyema ([place])  - left side Admit to progressive care. Start Zosyn/vanco.  This should cover for aerobic, anaerobic and MRSA.  Pt states his amoxil allergy was when he was a child. Only had rash. Blood cx obtained at [place] antigen obtained a [place]. Rounder will need to call [facility] to obtain culture results, Quant Gold results, etc. Will need pulmonary consult in AM for chest tube placement. Will likely need intrapleural lytics to help with empyema.  Pulmonary cavitary lesion - left side Has cavitary lesions  seen on CT. Percival Spanish test sent from [place]. Results still pending. Doubt TB but will need to continue airborne isolation until [place]. Rounder will need to call [facility] for [facility] results.  Sepsis without acute organ dysfunction (HCC) Due to cavitary pneumonia and empyema. Has WBC of 26K. Fulfills sepsis criteria. Has fever of 101.4 HR 124. Check CBC, CMP, procal, LDH.  Loculated pleural effusion See above. Will need chest tube and likely intrapleural lytics to help with effusion.  Hemiparesis affecting right side as late effect of cerebrovascular accident (CVA) ([place]) Chronic. Pt states he can walk on right LE. 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  Tobacco abuse Chronic.   DVT prophylaxis: SCDs Code Status: Full Code Family Communication: no family at bedside  Disposition Plan: return home  Consults called: none  Admission status: Inpatient, Telemetry bed   Kristopher Oppenheim, DO [facility] 07/02/2022, 9:06 PM

## 2022-07-02 NOTE — Assessment & Plan Note (Signed)
Has cavitary lesions seen on CT. Percival Spanish test sent from [place]. Results still pending. Doubt TB but will need to continue airborne isolation until [place]. Rounder will need to call [facility] for [facility] results.

## 2022-07-02 NOTE — Assessment & Plan Note (Addendum)
S/p chest tube placement by pulmonary with significant drainage of pustular discharge-Labs sent.  MRSA PCR negative.  Preliminary culture results from [facility] negative for blood and urine, sputum cultures with some yeast and GPC Infectious disease was also consulted. Vancomycin was discontinued and patient will continue on Zosyn -Follow-up labs and culture results from chest tube -Patient will need a repeat CT once drainage ceases. -Might need thoracic surgery involvement if needed VATS

## 2022-07-03 ENCOUNTER — Inpatient Hospital Stay (HOSPITAL_COMMUNITY): Payer: 59

## 2022-07-03 DIAGNOSIS — A419 Sepsis, unspecified organism: Secondary | ICD-10-CM | POA: Diagnosis not present

## 2022-07-03 DIAGNOSIS — J984 Other disorders of lung: Secondary | ICD-10-CM | POA: Diagnosis not present

## 2022-07-03 DIAGNOSIS — F17210 Nicotine dependence, cigarettes, uncomplicated: Secondary | ICD-10-CM | POA: Diagnosis not present

## 2022-07-03 DIAGNOSIS — J90 Pleural effusion, not elsewhere classified: Secondary | ICD-10-CM | POA: Diagnosis not present

## 2022-07-03 DIAGNOSIS — J869 Pyothorax without fistula: Secondary | ICD-10-CM | POA: Diagnosis not present

## 2022-07-03 LAB — PROCALCITONIN: Procalcitonin: 0.54 ng/mL

## 2022-07-03 LAB — COMPREHENSIVE METABOLIC PANEL
ALT: 13 U/L (ref 0–44)
AST: 12 U/L — ABNORMAL LOW (ref 15–41)
Albumin: 1.8 g/dL — ABNORMAL LOW (ref 3.5–5.0)
Alkaline Phosphatase: 101 U/L (ref 38–126)
Anion gap: 10 (ref 5–15)
BUN: 10 mg/dL (ref 6–20)
CO2: 20 mmol/L — ABNORMAL LOW (ref 22–32)
Calcium: 7.9 mg/dL — ABNORMAL LOW (ref 8.9–10.3)
Chloride: 101 mmol/L (ref 98–111)
Creatinine, Ser: 0.99 mg/dL (ref 0.61–1.24)
GFR, Estimated: >60 mL/min (ref >60)
Glucose, Bld: 104 mg/dL — ABNORMAL HIGH (ref 70–99)
Potassium: 3.6 mmol/L (ref 3.5–5.1)
Sodium: 131 mmol/L — ABNORMAL LOW (ref 135–145)
Total Bilirubin: 0.8 mg/dL (ref 0.3–1.2)
Total Protein: 6.5 g/dL (ref 6.5–8.1)

## 2022-07-03 LAB — CBC WITH DIFFERENTIAL/PLATELET
Abs Immature Granulocytes: 0.24 10*3/uL — ABNORMAL HIGH (ref 0.00–0.07)
Basophils Absolute: 0.1 10*3/uL (ref 0.0–0.1)
Basophils Relative: 0 %
Eosinophils Absolute: 0.1 10*3/uL (ref 0.0–0.5)
Eosinophils Relative: 0 %
HCT: 24.2 % — ABNORMAL LOW (ref 39.0–52.0)
Hemoglobin: 7.5 g/dL — ABNORMAL LOW (ref 13.0–17.0)
Immature Granulocytes: 1 %
Lymphocytes Relative: 11 %
Lymphs Abs: 2.3 10*3/uL (ref 0.7–4.0)
MCH: 23.9 pg — ABNORMAL LOW (ref 26.0–34.0)
MCHC: 31 g/dL (ref 30.0–36.0)
MCV: 77.1 fL — ABNORMAL LOW (ref 80.0–100.0)
Monocytes Absolute: 2 10*3/uL — ABNORMAL HIGH (ref 0.1–1.0)
Monocytes Relative: 10 %
Neutro Abs: 16 10*3/uL — ABNORMAL HIGH (ref 1.7–7.7)
Neutrophils Relative %: 78 %
Platelets: 651 10*3/uL — ABNORMAL HIGH (ref 150–400)
RBC: 3.14 MIL/uL — ABNORMAL LOW (ref 4.22–5.81)
RDW: 17.3 % — ABNORMAL HIGH (ref 11.5–15.5)
WBC: 20.8 10*3/uL — ABNORMAL HIGH (ref 4.0–10.5)
nRBC: 0 % (ref 0.0–0.2)

## 2022-07-03 LAB — BODY FLUID CELL COUNT WITH DIFFERENTIAL: Total Nucleated Cell Count, Fluid: 656500 cu mm — ABNORMAL HIGH (ref 0–1000)

## 2022-07-03 LAB — GLUCOSE, PLEURAL OR PERITONEAL FLUID: Glucose, Fluid: <20 mg/dL

## 2022-07-03 LAB — FERRITIN: Ferritin: 293 ng/mL (ref 24–336)

## 2022-07-03 LAB — LACTATE DEHYDROGENASE, PLEURAL OR PERITONEAL FLUID: LD, Fluid: >10000 U/L — ABNORMAL HIGH (ref 3–23)

## 2022-07-03 LAB — IRON AND TIBC
Iron: 8 ug/dL — ABNORMAL LOW (ref 45–182)
Saturation Ratios: 4 % — ABNORMAL LOW (ref 17.9–39.5)
TIBC: 185 ug/dL — ABNORMAL LOW (ref 250–450)
UIBC: 177 ug/dL

## 2022-07-03 LAB — HIV ANTIBODY (ROUTINE TESTING W REFLEX): HIV Screen 4th Generation wRfx: NONREACTIVE

## 2022-07-03 LAB — VITAMIN B12: Vitamin B-12: 2167 pg/mL — ABNORMAL HIGH (ref 180–914)

## 2022-07-03 LAB — PROTEIN, PLEURAL OR PERITONEAL FLUID: Total protein, fluid: <3 g/dL

## 2022-07-03 LAB — FOLATE: Folate: 6.4 ng/mL (ref >5.9)

## 2022-07-03 MED ORDER — BUDESONIDE 0.25 MG/2ML IN SUSP
0.2500 mg | Freq: Two times a day (BID) | RESPIRATORY_TRACT | Status: DC
  Administered 2022-07-03 – 2022-07-09 (×8): 0.25 mg via RESPIRATORY_TRACT
  Filled 2022-07-03 (×13): qty 2

## 2022-07-03 MED ORDER — BACLOFEN 10 MG PO TABS
20.0000 mg | ORAL_TABLET | Freq: Three times a day (TID) | ORAL | Status: DC
  Administered 2022-07-03 – 2022-07-09 (×17): 20 mg via ORAL
  Filled 2022-07-03 (×17): qty 2

## 2022-07-03 MED ORDER — HYDROMORPHONE HCL 1 MG/ML IJ SOLN
0.5000 mg | INTRAMUSCULAR | Status: DC | PRN
  Administered 2022-07-03 – 2022-07-04 (×5): 0.5 mg via INTRAVENOUS
  Filled 2022-07-03 (×5): qty 0.5

## 2022-07-03 MED ORDER — HYDROMORPHONE HCL 1 MG/ML IJ SOLN
0.5000 mg | Freq: Once | INTRAMUSCULAR | Status: AC
  Administered 2022-07-03: 0.5 mg via INTRAVENOUS
  Filled 2022-07-03: qty 0.5

## 2022-07-03 MED ORDER — KETOROLAC TROMETHAMINE 30 MG/ML IJ SOLN
30.0000 mg | Freq: Four times a day (QID) | INTRAMUSCULAR | Status: AC
  Administered 2022-07-03 – 2022-07-05 (×4): 30 mg via INTRAVENOUS
  Filled 2022-07-03 (×6): qty 1

## 2022-07-03 MED ORDER — ARFORMOTEROL TARTRATE 15 MCG/2ML IN NEBU
15.0000 ug | INHALATION_SOLUTION | Freq: Two times a day (BID) | RESPIRATORY_TRACT | Status: DC
  Administered 2022-07-03 – 2022-07-09 (×8): 15 ug via RESPIRATORY_TRACT
  Filled 2022-07-03 (×13): qty 2

## 2022-07-03 NOTE — Procedures (Signed)
Insertion of Chest Tube Procedure Note  [name]  [id number]  09/22/1985  Date:07/03/22  Time:9:33 AM    Provider Performing: Candee Furbish   Procedure: Pleural Catheter Insertion w/ Imaging Guidance [phone])  Indication(s) Effusion  Consent Risks of the procedure as well as the alternatives and risks of each were explained to the patient and/or caregiver.  Consent for the procedure was obtained and is signed in the bedside chart  Anesthesia Topical only with 1% lidocaine    Time Out Verified patient identification, verified procedure, site/side was marked, verified correct patient position, special equipment/implants available, medications/allergies/relevant history reviewed, required imaging and test results available.   Sterile Technique Maximal sterile technique including full sterile barrier drape, hand hygiene, sterile gown, sterile gloves, mask, hair covering, sterile ultrasound probe cover (if used).   Procedure Description Ultrasound used to identify appropriate pleural anatomy for placement and overlying skin marked. Area of placement cleaned and draped in sterile fashion.  A 14 French pigtail pleural catheter was placed into the left pleural space using Seldinger technique. Appropriate return of pus was obtained.  The tube was connected to atrium and placed on -20 cm H2O wall suction.   Complications/Tolerance None; patient tolerated the procedure well. Chest X-ray is ordered to verify placement.   EBL Minimal  Specimen(s) fluid

## 2022-07-03 NOTE — Progress Notes (Signed)
Here with empyema, reviewed CT, no TB rule out needed.  Will place pigtail and drain as best we can; not going to be much benefit with VATS here given relatively small size but can involve TCTS should he develop clear BPF.  Erskine Emery MD PCCM

## 2022-07-03 NOTE — Progress Notes (Signed)
Mobility Specialist: Progress Note   07/03/22 1739  Mobility  Activity Refused mobility   Pt refused mobility stating he is waiting on his medicine regarding tremor with RLE before attempting ambulation, RN notified. Will f/u as able.   [place] Camrie Stock Mobility Specialist Please contact via SecureChat or Rehab office at [phone]

## 2022-07-03 NOTE — Progress Notes (Signed)
Progress Note   Patient: [name] [id number] DOB: 05-13-1986 DOA: 07/02/2022     1 DOS: the patient was seen and examined on 07/03/2022   Brief hospital course: Taken from H&P.  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  He was seen by his local PCP.  Chest x-ray demonstrated pleural effusion.  And a white count of to 29,000.  Patient referred to the local emergency department.  CT scan performed there showed a large loculated pleural effusion with empyema.  He also had a cavitary lesion.  Transfer requested from [facility] to [facility] for CT surgery evaluation.   Patient has numerous dental caries.  He states that he has been coughing up thick sputum.  Denies any hemoptysis.   On arrival to [facility], temp 101.4 heart rate 124 blood pressure 136/83 satting 95% on room air.   His CT scan from [facility] was reviewed.  He has a cavitary left upper lobe lesion.  He also has a loculated pleural effusion.  Appearance is consistent with empyema.    ED Course: CT chest showed loculated left sided effusion consistent with empyema. WBC 26K.  2/18: Patient with tachycardia, EKG with sinus tachycardia, no other abnormality. Procalcitonin at 0.57, improving leukocytosis at 20.8.  Hemoglobin at 7.5, iron at 8 and saturation 4% along with low TIBC at 185 which is more consistent with anemia of chronic disease and iron deficiency. Sputum culture done at [facility] with some yeast and gram-positive cocci. Blood and urine cultures negative so far. Pulmonary was consulted and a chest tube was placed with foul-smelling purulent discharge-Labs sent. Infectious disease was also consulted.     Assessment and Plan: * Empyema ([place])  - left side S/p chest tube placement by pulmonary  with significant drainage of pustular discharge-Labs sent.  MRSA PCR negative.  Preliminary culture results from [facility] negative for blood and urine, sputum cultures with some yeast and GPC Infectious disease was also consulted. Vancomycin was discontinued and patient will continue on Zosyn -Follow-up labs and culture results from chest tube -Patient will need a repeat CT once drainage ceases. -Might need thoracic surgery involvement if needed VATS  Pulmonary cavitary lesion - left side Has cavitary lesions seen on CT. Percival Spanish test sent from [place]. Results still pending.  Pulmonary discontinued precautions as less likely TB  Sepsis without acute organ dysfunction ([place]) Due to cavitary pneumonia and empyema. Has WBC of 26K on admission. Fulfills sepsis criteria. Has fever of 101.4 HR 124.  Leukocytosis improving, procalcitonin at 0.57. -Follow-up culture results -Continue Zosyn for now  Loculated pleural effusion See above. .  Hemiparesis affecting right side as late effect of cerebrovascular accident (CVA) ([place]) Chronic. Pt states he can walk on right LE. 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  Tobacco abuse Chronic. -Nicotine patch as needed   Subjective: Patient was seen and examined today.  He was complaining of left-sided, posterolateral chest pain around the chest tube insertion site.  Patient is experiencing intermittent fever and chills and cough for about a month now.  Physical Exam: Vitals:   07/03/22 0900 07/03/22 0920 07/03/22 1052 07/03/22 1103  BP: 132/88  125/86   Pulse: (!) 120 (!) 114 (!) 107   Resp: (!) 25 18 (!) 21   Temp:   98.7 F (37.1 C)  TempSrc:   Oral   SpO2: 95% 95% 94% 98%  Weight:      Height:       General.     In no acute distress. Pulmonary.  Bronchial breathing on right with some scattered wheeze, normal respiratory effort. CV.  Regular rate and rhythm, no JVD, rub or murmur. Abdomen.  Soft, nontender, nondistended, BS  positive. CNS.  Alert and oriented .  Right upper extremity contracture Extremities.  No edema, no cyanosis, pulses intact and symmetrical. Psychiatry.  Judgment and insight appears normal.   Data Reviewed: Prior data reviewed  Family Communication: Discussed with mother at bedside  Disposition: Status is: Inpatient Remains inpatient appropriate because: Severity of illness  Planned Discharge Destination: Home  Time spent: 50 minutes  This record has been created using Systems analyst. Errors have been sought and corrected,but may not always be located. Such creation errors do not reflect on the standard of care.   Author: Lorella Nimrod, MD 07/03/2022 1:01 PM  For on call review [email or web address].

## 2022-07-03 NOTE — H&P (Addendum)
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  Went to [place] found to have cavitary PNA + empyema so sent to [facility].  Seen in [place], on RA, in good spirits.  Pertinent  Medical History   Past Medical History:  Diagnosis Date   Asthma    Cerebral thrombosis with cerebral infarction ([place])    Headache(784.0)    Spastic hemiplegia affecting dominant side ([place])    Stroke ([place])    TBI (traumatic brain injury) ([place])      Significant Hospital Events: Including procedures, antibiotic start and stop dates in addition to other pertinent events   2/17 admit  Interim History / Subjective:  NAD  Objective   Blood pressure 132/88, pulse (!) 120, temperature 98.8 F (37.1 C), temperature source Oral, resp. rate (!) 25, height 6' 3"$  (1.905 m), weight 101.4 kg, SpO2 95 %.        Intake/Output Summary (Last 24 hours) at 07/03/2022 [id number] Last data filed at 07/03/2022 0915 Gross per 24 hour  Intake 1670.25 ml  Output 3150 ml  Net -1479.75 ml   Filed Weights   07/02/22 2007  Weight: 101.4 kg    Examination: General: no distress HENT: poor dentition Lungs: wheezing bilaterally, complex fluid collection L chest by [place] Cardiovascular: tachy, sinus on monitor, ext warm Abdomen: soft, +BS Extremities: no edema Neuro: RUE contracted cannot move, has weakness on RLE, left moves purposefully with good strength Psych: Aox4 for me, pleasant affect  CT reviewed in PACS: small apical necrotic focus and complex LLL pleural space with air/debris/fluid  Resolved Hospital Problem list   N/A  Assessment & Plan:  Necrotic pneumonia with  empyema formation- probably from teeth.  No risk factors nor is imaging c/w TB. Asthma- may be in flare - Do not see need for airborne isolation, fluid removed foul smelling pus c/w anaerobic infection - Vanc/zosyn, pleural/sputum cultures - [place] to -20, may needs lytics followed by pleurodesis.   TCTS eval if we can't get space cleared with tube. - Add laba/ics nebs, continue PRN duonebs - Steroids if continues to have bronchospasm tomorrow - Will follow  Best Practice (right click and "Reselect all SmartList Selections" daily)  Per primary  Labs   CBC: Recent Labs  Lab 07/02/22 2119 07/03/22 0030  WBC 21.2* 20.8*  NEUTROABS 16.7* 16.0*  HGB 7.9* 7.5*  HCT 26.1* 24.2*  MCV 78.4* 77.1*  PLT 724* 651*    Basic Metabolic Panel: Recent Labs  Lab 07/02/22 2119 07/03/22 0030  NA 129* 131*  K 3.5 3.6  CL 99 101  CO2 21* 20*  GLUCOSE 99 104*  BUN 10 10  CREATININE 0.98 0.99  CALCIUM 7.8* 7.9*   GFR: Estimated Creatinine Clearance: 134.5 mL/min (by C-G formula based on SCr of 0.99 mg/dL). Recent Labs  Lab 07/02/22 2119 07/03/22 0030  PROCALCITON 0.57 0.54  WBC 21.2* 20.8*    Liver Function Tests: Recent Labs  Lab 07/02/22 2119 07/03/22 0030  AST 13* 12*  ALT 12 13  ALKPHOS 119 101  BILITOT 0.2* 0.8  PROT 6.7 6.5  ALBUMIN 1.9* 1.8*   No results for  input(s): "LIPASE", "AMYLASE" in the last 168 hours. No results for input(s): "AMMONIA" in the last 168 hours.  ABG No results found for: "PHART", "PCO2ART", "PO2ART", "HCO3", "TCO2", "ACIDBASEDEF", "O2SAT"   Coagulation Profile: No results for input(s): "INR", "PROTIME" in the last 168 hours.  Cardiac Enzymes: No results for input(s): "CKTOTAL", "CKMB", "CKMBINDEX", "TROPONINI" in the last 168 hours.  HbA1C: Hgb A1c MFr Bld  Date/Time Value Ref Range Status  10/04/2010 01:59 PM  <5.7 % Final   5.5 (NOTE)                                                                       According to the ADA  Clinical Practice Recommendations for 2011, when HbA1c is used as a screening test:   >=6.5%   Diagnostic of Diabetes Mellitus           (if abnormal result  is confirmed)  5.7-6.4%   Increased risk of developing Diabetes Mellitus  References:Diagnosis and Classification of Diabetes Mellitus,Diabetes [id number] 1):S62-S69 and Standards of Medical Care in         Diabetes - 2011,Diabetes [id number]  (Suppl 1):S11-S61.    CBG: No results for input(s): "GLUCAP" in the last 168 hours.  Review of Systems:    Positive Symptoms in bold:  Constitutional fevers, chills, weight loss, fatigue, anorexia, malaise  Eyes decreased vision, double vision, eye irritation  Ears, Nose, Mouth, Throat sore throat, trouble swallowing, sinus congestion  Cardiovascular chest pain, paroxysmal nocturnal dyspnea, lower ext edema, palpitations   Respiratory SOB, cough, DOE, hemoptysis, wheezing  Gastrointestinal nausea, vomiting, diarrhea  Genitourinary burning with urination, trouble urinating  Musculoskeletal joint aches, joint swelling, back pain  Integumentary  rashes, skin lesions  Neurological focal weakness, focal numbness, trouble speaking, headaches  Psychiatric depression, anxiety, confusion  Endocrine polyuria, polydipsia, cold intolerance, heat intolerance  Hematologic abnormal bruising, abnormal bleeding, unexplained nose bleeds  Allergic/Immunologic recurrent infections, hives, swollen lymph nodes     Past Medical History:  He,  has a past medical history of Asthma, Cerebral thrombosis with cerebral infarction ([place]), Headache(784.0), Spastic hemiplegia affecting dominant side ([place]), Stroke ([place]), and TBI (traumatic brain injury) ([place]).   Surgical History:   Past Surgical History:  Procedure Laterality Date   ANKLE SURGERY     EYE SURGERY     TONSILECTOMY, ADENOIDECTOMY, BILATERAL MYRINGOTOMY AND TUBES       Social History:   reports that he has been smoking cigarettes. He uses  smokeless tobacco. He reports that he does not currently use alcohol. He reports current drug use. Drug: Marijuana.   Family History:  His family history includes Anxiety disorder in his mother; Arthritis in his mother; Diabetes in his father.   Allergies Allergies  Allergen Reactions   Amoxicillin      Home Medications  Prior to Admission medications   Medication Sig Start Date End Date Taking? Authorizing Provider  albuterol (PROVENTIL) (2.5 MG/3ML) 0.083% nebulizer solution SMARTSIG:1 Vial(s) Via Nebulizer 4 Times Daily PRN 06/30/22  Yes [provider]  albuterol (VENTOLIN HFA) 108 (90 Base) MCG/ACT inhaler Inhale 2 puffs into the lungs every 4 (four) hours as needed. 06/30/22  Yes [provider]  amitriptyline (ELAVIL) 50 MG tablet Take 50 mg by mouth  at bedtime. 06/30/22  Yes [provider]  clarithromycin (BIAXIN) 500 MG tablet Take 500 mg by mouth 2 (two) times daily. 06/30/22  Yes [provider]  cloNIDine (CATAPRES) 0.1 MG tablet Take 0.1 mg by mouth 2 (two) times daily. 06/30/22  Yes [provider]  DULoxetine (CYMBALTA) 60 MG capsule Take 60 mg by mouth 2 (two) times daily. 06/30/22  Yes [provider]  fluticasone (FLOVENT HFA) 110 MCG/ACT inhaler Inhale 2 puffs into the lungs 2 (two) times daily. 06/30/22  Yes [provider]  lactulose (CHRONULAC) 10 GM/15ML solution Take 10 g by mouth daily. 06/30/22  Yes [provider]  levocetirizine (XYZAL) 5 MG tablet Take 5 mg by mouth daily. 06/30/22  Yes [provider]  LINZESS 290 MCG CAPS capsule Take 290 mcg by mouth daily. 06/30/22  Yes [provider]  omeprazole (PRILOSEC) 20 MG capsule Take 20 mg by mouth daily. 06/30/22  Yes [provider]  predniSONE (DELTASONE) 10 MG tablet Take 10 mg by mouth 2 (two) times daily. 06/30/22  Yes [provider]  aspirin 325 MG tablet Take 325 mg by mouth daily.    [provider]   baclofen (LIORESAL) 20 MG tablet TAKE 1 TABLET FOUR TIMES DAILY. 05/17/12   Meredith Staggers, MD  citalopram (CELEXA) 40 MG tablet TAKE 1 TABLET AT BEDTIME. 05/17/12   Meredith Staggers, MD  doxycycline (VIBRAMYCIN) 100 MG capsule Take 100 mg by mouth 2 (two) times daily.    [provider]  ergocalciferol (VITAMIN D2) 50000 UNITS capsule Take 50,000 Units by mouth once a week.    [provider]  gabapentin (NEURONTIN) 300 MG capsule TAKE 1 CAPSULE THREE TIMES DAILY. 05/17/12   Meredith Staggers, MD  nortriptyline (PAMELOR) 50 MG capsule TAKE 1 OR 2 CAPSULES AT BEDTIME. 05/17/12   Meredith Staggers, MD  pravastatin (PRAVACHOL) 40 MG tablet TAKE 1 TABLET AT BEDTIME. 05/17/12   Meredith Staggers, MD  Tamsulosin HCl (FLOMAX) 0.4 MG CAPS Take 1 capsule (0.4 mg total) by mouth daily after supper. 01/20/12   Meredith Staggers, MD  topiramate (TOPAMAX) 100 MG tablet 145m 2 po qhs 04/18/12   SMeredith Staggers MD     Critical care time: n/a

## 2022-07-03 NOTE — Progress Notes (Signed)
MRSA PCR came back neg. Ok to stop vanc per Dr Reesa Chew.  Onnie Boer, PharmD, BCIDP, AAHIVP, CPP Infectious Disease Pharmacist 07/03/2022 9:41 AM

## 2022-07-03 NOTE — Consult Note (Addendum)
[name] for Infectious Disease  Total days of antibiotics 2 Reason for Consult: empyema   Referring Physician: amin  Principal Problem:   Empyema ([place])  - left side Active Problems:   Hemiparesis affecting right side as late effect of cerebrovascular accident (CVA) ([place])   Loculated pleural effusion   Sepsis without acute organ dysfunction (HCC)   Pulmonary cavitary lesion - left side   Tobacco abuse    HPI: [name] is a 37 y.o. male with hx of CVA and right sided hemparesis who was initially at [facility] for hx of chills, drenching nightsweats, shortness of breath and cough x 3-4 wk. He was found to have leukocytosis of [id number] with complicated cavitary pneumonia with loculated pleural effusion. He was transferred to [facility] for CT evaluation and management. On admit, his fever was 103.51F, and wbc of 20K. Cxr per my read shows left sided pleural effusion and infiltrates. Patient was resistant to seeking care earlier on his illness and it was his mother's insistence that had them seek care yesterday. He denies dental complaints but appears to have dental caries. At outside hospital, blood cx still remain negative though < 24hrs. He reports pain at chest tube site. Has tannish fluid roughly 888m in chest tube collection system already Past Medical History:  Diagnosis Date   Asthma    Cerebral thrombosis with cerebral infarction ([facility]    Headache(784.0)    Spastic hemiplegia affecting dominant side ([facility]    Stroke (HCC)    TBI (traumatic brain injury) ([facility]     Allergies:  Allergies  Allergen Reactions   Amoxicillin Other (See Comments)    UNK reaction     MEDICATIONS:  arformoterol  15 mcg Nebulization BID   budesonide (PULMICORT) nebulizer solution  0.25 mg Nebulization BID   ketorolac  30 mg Intravenous Q6H    Social History   Tobacco Use   Smoking status: Every Day    Types: Cigarettes   Smokeless tobacco: Current  Substance Use Topics   Alcohol  use: Not Currently   Drug use: Yes    Types: Marijuana    Family History  Problem Relation Age of Onset   Arthritis Mother    Anxiety disorder Mother    Diabetes Father      Review of Systems  Constitutional: positivie for fever, chills, diaphoresis, activity change, appetite change, fatigue and unexpected weight change.  HENT: Negative for congestion, sore throat, rhinorrhea, sneezing, trouble swallowing and sinus pressure.  Eyes: Negative for photophobia and visual disturbance.  Respiratory: positive for cough, chest tightness, shortness of breath, wheezing and stridor.  Cardiovascular: Negative for chest pain, palpitations and leg swelling.  Gastrointestinal: Negative for nausea, vomiting, abdominal pain, diarrhea, constipation, blood in stool, abdominal distention and anal bleeding.  Genitourinary: Negative for dysuria, hematuria, flank pain and difficulty urinating.  Musculoskeletal: Negative for myalgias, back pain, joint swelling, arthralgias and gait problem.  Skin: Negative for color change, pallor, rash and wound.  Neurological: Negative for dizziness, tremors, weakness and light-headedness.  Hematological: Negative for adenopathy. Does not bruise/bleed easily.  Psychiatric/Behavioral: Negative for behavioral problems, confusion, sleep disturbance, dysphoric mood, decreased concentration and agitation.    OBJECTIVE: Temp:  [98.1 F (36.7 C)-103.2 F (39.6 C)] 98.8 F (37.1 C) (02/18 0740) Pulse Rate:  [108-127] 114 (02/18 0920) Resp:  [18-25] 18 (02/18 0920) BP: (121-137)/(81-88) 132/88 (02/18 0900) SpO2:  [93 %-97 %] 95 % (02/18 0920) Weight:  [101.4 kg] 101.4 kg (02/17 2007) Physical Exam  Constitutional: He is oriented to person, place, and time. He appears well-developed and well-nourished. No distress.  HENT:  Mouth/Throat: Oropharynx is clear and moist. No oropharyngeal exudate.  Cardiovascular: Normal rate, regular rhythm and normal heart sounds. Exam  reveals no gallop and no friction rub.  No murmur heard.  Pulmonary/Chest: Effort normal and breath sounds normal. No respiratory distress. He has no wheezes. Left sided chest tube Abdominal: Soft. Bowel sounds are normal. He exhibits no distension. There is no tenderness.  Lymphadenopathy:  He has no cervical adenopathy.  Neurological: He is alert and oriented to person, place, and time. Right arm contracture Skin: Skin is warm and dry. No rash noted. No erythema.  Psychiatric: He has a normal mood and affect. His behavior is normal.    LABS: Results for orders placed or performed during the hospital encounter of 07/02/22 (from the past 48 hour(s))  MRSA Next Gen by PCR, Nasal     Status: None   Collection Time: 07/02/22  8:10 PM   Specimen: Nasal Mucosa; Nasal Swab  Result Value Ref Range   MRSA by PCR Next Gen NOT DETECTED NOT DETECTED    Comment: (NOTE) The GeneXpert MRSA Assay (FDA approved for NASAL specimens only), is one component of a comprehensive MRSA colonization surveillance program. It is not intended to diagnose MRSA infection nor to guide or monitor treatment for MRSA infections. Test performance is not FDA approved in patients less than 65 years old. Performed at [facility], [place] [address], [place], [place] [address]   CBC with Differential/Platelet     Status: Abnormal   Collection Time: 07/02/22  9:19 PM  Result Value Ref Range   WBC 21.2 (H) 4.0 - 10.5 K/uL   RBC 3.33 (L) 4.22 - 5.81 MIL/uL   Hemoglobin 7.9 (L) 13.0 - 17.0 g/dL   HCT 26.1 (L) 39.0 - 52.0 %   MCV 78.4 (L) 80.0 - 100.0 fL   MCH 23.7 (L) 26.0 - 34.0 pg   MCHC 30.3 30.0 - 36.0 g/dL   RDW 17.2 (H) 11.5 - 15.5 %   Platelets 724 (H) 150 - 400 K/uL   nRBC 0.0 0.0 - 0.2 %   Neutrophils Relative % 80 %   Neutro Abs 16.7 (H) 1.7 - 7.7 K/uL   Lymphocytes Relative 10 %   Lymphs Abs 2.2 0.7 - 4.0 K/uL   Monocytes Relative 9 %   Monocytes Absolute 1.9 (H) 0.1 - 1.0 K/uL   Eosinophils  Relative 0 %   Eosinophils Absolute 0.1 0.0 - 0.5 K/uL   Basophils Relative 0 %   Basophils Absolute 0.1 0.0 - 0.1 K/uL   Immature Granulocytes 1 %   Abs Immature Granulocytes 0.26 (H) 0.00 - 0.07 K/uL    Comment: Performed at [facility] [address], [place], [place] [address]  Comprehensive metabolic panel     Status: Abnormal   Collection Time: 07/02/22  9:19 PM  Result Value Ref Range   Sodium 129 (L) 135 - 145 mmol/L   Potassium 3.5 3.5 - 5.1 mmol/L   Chloride 99 98 - 111 mmol/L   CO2 21 (L) 22 - 32 mmol/L   Glucose, Bld 99 70 - 99 mg/dL    Comment: Glucose reference range applies only to samples taken after fasting for at least 8 hours.   BUN 10 6 - 20 mg/dL   Creatinine, Ser 0.98 0.61 - 1.24 mg/dL   Calcium 7.8 (L) 8.9 - 10.3 mg/dL  Total Protein 6.7 6.5 - 8.1 g/dL   Albumin 1.9 (L) 3.5 - 5.0 g/dL   AST 13 (L) 15 - 41 U/L   ALT 12 0 - 44 U/L   Alkaline Phosphatase 119 38 - 126 U/L   Total Bilirubin 0.2 (L) 0.3 - 1.2 mg/dL   GFR, Estimated >60 >60 mL/min    Comment: (NOTE) Calculated using the CKD-EPI Creatinine Equation (2021)    Anion gap 9 5 - 15    Comment: Performed at [facility] [address], [place], [place] [address]  Lactate dehydrogenase     Status: None   Collection Time: 07/02/22  9:19 PM  Result Value Ref Range   LDH 106 98 - 192 U/L    Comment: Performed at [facility], [place] [address] [place], [place] [address]  Procalcitonin - Baseline     Status: None   Collection Time: 07/02/22  9:19 PM  Result Value Ref Range   Procalcitonin 0.57 ng/mL    Comment:        Interpretation: PCT > 0.5 ng/mL and <= 2 ng/mL: Systemic infection (sepsis) is possible, but other conditions are known to elevate PCT as well. (NOTE)       Sepsis PCT Algorithm           Lower Respiratory Tract                                      Infection PCT Algorithm    ----------------------------     ----------------------------         PCT < 0.25 ng/mL                 PCT < 0.10 ng/mL          Strongly encourage             Strongly discourage   discontinuation of antibiotics    initiation of antibiotics    ----------------------------     -----------------------------       PCT 0.25 - 0.50 ng/mL            PCT 0.10 - 0.25 ng/mL               OR       >80% decrease in PCT            Discourage initiation of                                            antibiotics      Encourage discontinuation           of antibiotics    ----------------------------     -----------------------------         PCT >= 0.50 ng/mL              PCT 0.26 - 0.50 ng/mL                AND       <80% decrease in PCT             Encourage initiation of  antibiotics       Encourage continuation           of antibiotics    ----------------------------     -----------------------------        PCT >= 0.50 ng/mL                  PCT > 0.50 ng/mL               AND         increase in PCT                  Strongly encourage                                      initiation of antibiotics    Strongly encourage escalation           of antibiotics                                     -----------------------------                                           PCT <= 0.25 ng/mL                                                 OR                                        > 80% decrease in PCT                                      Discontinue / Do not initiate                                             antibiotics  Performed at [facility], [address], [place], [place] [address]   Procalcitonin     Status: None   Collection Time: 07/03/22 12:30 AM  Result Value Ref Range   Procalcitonin 0.54 ng/mL    Comment:        Interpretation: PCT > 0.5 ng/mL and <= 2 ng/mL: Systemic infection (sepsis) is possible, but other conditions are known to elevate PCT as well. (NOTE)       Sepsis PCT Algorithm           Lower Respiratory  Tract                                      Infection PCT Algorithm    ----------------------------     ----------------------------         PCT < 0.25 ng/mL  PCT < 0.10 ng/mL          Strongly encourage             Strongly discourage   discontinuation of antibiotics    initiation of antibiotics    ----------------------------     -----------------------------       PCT 0.25 - 0.50 ng/mL            PCT 0.10 - 0.25 ng/mL               OR       >80% decrease in PCT            Discourage initiation of                                            antibiotics      Encourage discontinuation           of antibiotics    ----------------------------     -----------------------------         PCT >= 0.50 ng/mL              PCT 0.26 - 0.50 ng/mL                AND       <80% decrease in PCT             Encourage initiation of                                             antibiotics       Encourage continuation           of antibiotics    ----------------------------     -----------------------------        PCT >= 0.50 ng/mL                  PCT > 0.50 ng/mL               AND         increase in PCT                  Strongly encourage                                      initiation of antibiotics    Strongly encourage escalation           of antibiotics                                     -----------------------------                                           PCT <= 0.25 ng/mL                                                 OR                                        >  80% decrease in PCT                                      Discontinue / Do not initiate                                             antibiotics  Performed at [facility], [place] [address], [place], [place] [address]   Iron and TIBC     Status: Abnormal   Collection Time: 07/03/22 12:30 AM  Result Value Ref Range   Iron 8 (L) 45 - 182 ug/dL   TIBC 185 (L) 250 - 450 ug/dL   Saturation Ratios 4 (L) 17.9 - 39.5  %   UIBC 177 ug/dL    Comment: Performed at [facility], [place] [address], [place], [place] [address]  HIV Antibody (routine testing w rflx)     Status: None   Collection Time: 07/03/22 12:30 AM  Result Value Ref Range   HIV Screen 4th Generation wRfx Non Reactive Non Reactive    Comment: Performed at [facility], [place] [address], [place], [place] [address]  Comprehensive metabolic panel     Status: Abnormal   Collection Time: 07/03/22 12:30 AM  Result Value Ref Range   Sodium 131 (L) 135 - 145 mmol/L   Potassium 3.6 3.5 - 5.1 mmol/L   Chloride 101 98 - 111 mmol/L   CO2 20 (L) 22 - 32 mmol/L   Glucose, Bld 104 (H) 70 - 99 mg/dL    Comment: Glucose reference range applies only to samples taken after fasting for at least 8 hours.   BUN 10 6 - 20 mg/dL   Creatinine, Ser 0.99 0.61 - 1.24 mg/dL   Calcium 7.9 (L) 8.9 - 10.3 mg/dL   Total Protein 6.5 6.5 - 8.1 g/dL   Albumin 1.8 (L) 3.5 - 5.0 g/dL   AST 12 (L) 15 - 41 U/L   ALT 13 0 - 44 U/L   Alkaline Phosphatase 101 38 - 126 U/L   Total Bilirubin 0.8 0.3 - 1.2 mg/dL   GFR, Estimated >60 >60 mL/min    Comment: (NOTE) Calculated using the CKD-EPI Creatinine Equation (2021)    Anion gap 10 5 - 15    Comment: Performed at [facility], [place] [address], [place], [place] [address]  CBC with Differential/Platelet     Status: Abnormal   Collection Time: 07/03/22 12:30 AM  Result Value Ref Range   WBC 20.8 (H) 4.0 - 10.5 K/uL   RBC 3.14 (L) 4.22 - 5.81 MIL/uL   Hemoglobin 7.5 (L) 13.0 - 17.0 g/dL    Comment: Reticulocyte Hemoglobin testing may be clinically indicated, consider ordering this additional test [id number]    HCT 24.2 (L) 39.0 - 52.0 %   MCV 77.1 (L) 80.0 - 100.0 fL   MCH 23.9 (L) 26.0 - 34.0 pg   MCHC 31.0 30.0 - 36.0 g/dL   RDW 17.3 (H) 11.5 - 15.5 %   Platelets 651 (H) 150 - 400 K/uL   nRBC 0.0 0.0 - 0.2 %   Neutrophils Relative % 78 %   Neutro Abs 16.0 (H) 1.7 - 7.7 K/uL   Lymphocytes Relative 11 %    Lymphs Abs 2.3 0.7 -  4.0 K/uL   Monocytes Relative 10 %   Monocytes Absolute 2.0 (H) 0.1 - 1.0 K/uL   Eosinophils Relative 0 %   Eosinophils Absolute 0.1 0.0 - 0.5 K/uL   Basophils Relative 0 %   Basophils Absolute 0.1 0.0 - 0.1 K/uL   Immature Granulocytes 1 %   Abs Immature Granulocytes 0.24 (H) 0.00 - 0.07 K/uL    Comment: Performed at [facility], [place] [address], [place], [place] [address]    MICRO: reviewed IMAGING: DG Chest Port 1 View  Result Date: 07/03/2022 CLINICAL DATA:  Chest tube. EXAM: PORTABLE CHEST 1 VIEW COMPARISON:  07/01/2022. FINDINGS: New left-sided chest tube. Pigtail curl is superimposed over the left hilar structures. Decreased left pleural fluid. Persistent opacity, both linear and patchy airspace, noted extending from the left mid lung to the lung base. Small pneumothorax noted at the lateral, inferior left hemithorax. Right lung remains clear. No right pleural effusion or pneumothorax. IMPRESSION: 1. Status post left sided chest tube placement. Significant decrease in the loculated left pleural effusion. Residual left mid to lower lung opacity is consistent with atelectasis and/or pneumonia and residual fluid. 2. Small pneumothorax noted, left lateral lower hemithorax. Electronically Signed   By: Lajean Manes M.D.   On: 07/03/2022 10:00    Assessment/Plan:  36yo M with right sided paraplegia admitted for left sided empyema. Currently tolerating piptazo (hx of childhood allergy to amoxicillin)   - continue with piptazo for the time being, will need coverage for likely mouth flora pathogens - will follow up on blood and pleural fluid culture - recommend repeat chest CT once drainage ceases. Appears to easily drain at this time, should it appear loculated, may need to consider CT surgery to evaluate for VATS -leukocytosis = likely to improve as infection is controlled -recommend panorex to see source of infection  Discuss getting egg free flu vaccine at  discharge  May need amox challenge during hospitalization for transition to oral abtx.  Elzie [facility] for Infectious Diseases [phone]

## 2022-07-03 NOTE — Hospital Course (Addendum)
Taken from H&P.  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  He was seen by his local PCP.  Chest x-ray demonstrated pleural effusion.  And a white count of to 29,000.  Patient referred to the local emergency department.  CT scan performed there showed a large loculated pleural effusion with empyema.  He also had a cavitary lesion.  Transfer requested from [facility] to [facility] for CT surgery evaluation.   Patient has numerous dental caries.  He states that he has been coughing up thick sputum.  Denies any hemoptysis.   On arrival to [facility], temp 101.4 heart rate 124 blood pressure 136/83 satting 95% on room air.   His CT scan from [facility] was reviewed.  He has a cavitary left upper lobe lesion.  He also has a loculated pleural effusion.  Appearance is consistent with empyema.    ED Course: CT chest showed loculated left sided effusion consistent with empyema. WBC 26K.  2/18: Patient with tachycardia, EKG with sinus tachycardia, no other abnormality. Procalcitonin at 0.57, improving leukocytosis at 20.8.  Hemoglobin at 7.5, iron at 8 and saturation 4% along with low TIBC at 185 which is more consistent with anemia of chronic disease and iron deficiency. Sputum culture done at [facility] with some yeast and gram-positive cocci. Blood and urine cultures negative so far. Pulmonary was consulted and a chest tube was placed with foul-smelling purulent discharge-Labs sent. Infectious disease was also consulted.  2/19: Patient remained afebrile over the past 24 hours.  Exudative pleural fluid with markedly elevated WBC at 656 500 and LDH more than 10,000, low protein and glucose.  Preliminary cultures growing gram-positive cocci.  Small pneumothorax was noted after chest tube  placement Repeat chest x-ray today with left-sided hydropneumothorax.  Fluid component is unchanged from prior exam.  Air component has decreased from prior exam.  Persistent left basilar airspace opacity.  Pulmonary injected fibrinolytic through existing chest tube today. Echocardiogram today was within normal limit.  2/20: Patient remained afebrile with stable vitals.  Pleural fluid cultures with strep intermedius, chest tube output at 330.  Chest x-ray this morning with unchanged left hydropneumothorax with overlying left basilar opacities.

## 2022-07-04 ENCOUNTER — Inpatient Hospital Stay (HOSPITAL_COMMUNITY): Payer: 59

## 2022-07-04 DIAGNOSIS — R7881 Bacteremia: Secondary | ICD-10-CM

## 2022-07-04 DIAGNOSIS — J869 Pyothorax without fistula: Secondary | ICD-10-CM | POA: Diagnosis not present

## 2022-07-04 DIAGNOSIS — J90 Pleural effusion, not elsewhere classified: Secondary | ICD-10-CM | POA: Diagnosis not present

## 2022-07-04 DIAGNOSIS — F17210 Nicotine dependence, cigarettes, uncomplicated: Secondary | ICD-10-CM | POA: Diagnosis not present

## 2022-07-04 DIAGNOSIS — J984 Other disorders of lung: Secondary | ICD-10-CM | POA: Diagnosis not present

## 2022-07-04 DIAGNOSIS — A419 Sepsis, unspecified organism: Secondary | ICD-10-CM | POA: Diagnosis not present

## 2022-07-04 LAB — CBC WITH DIFFERENTIAL/PLATELET
Abs Immature Granulocytes: 0.2 10*3/uL — ABNORMAL HIGH (ref 0.00–0.07)
Basophils Absolute: 0 10*3/uL (ref 0.0–0.1)
Basophils Relative: 0 %
Eosinophils Absolute: 0.3 10*3/uL (ref 0.0–0.5)
Eosinophils Relative: 3 %
HCT: 26.5 % — ABNORMAL LOW (ref 39.0–52.0)
Hemoglobin: 8.1 g/dL — ABNORMAL LOW (ref 13.0–17.0)
Immature Granulocytes: 2 %
Lymphocytes Relative: 22 %
Lymphs Abs: 2.5 10*3/uL (ref 0.7–4.0)
MCH: 24 pg — ABNORMAL LOW (ref 26.0–34.0)
MCHC: 30.6 g/dL (ref 30.0–36.0)
MCV: 78.6 fL — ABNORMAL LOW (ref 80.0–100.0)
Monocytes Absolute: 1 10*3/uL (ref 0.1–1.0)
Monocytes Relative: 9 %
Neutro Abs: 7.3 10*3/uL (ref 1.7–7.7)
Neutrophils Relative %: 64 %
Platelets: 674 10*3/uL — ABNORMAL HIGH (ref 150–400)
RBC: 3.37 MIL/uL — ABNORMAL LOW (ref 4.22–5.81)
RDW: 17.2 % — ABNORMAL HIGH (ref 11.5–15.5)
WBC: 11.4 10*3/uL — ABNORMAL HIGH (ref 4.0–10.5)
nRBC: 0 % (ref 0.0–0.2)

## 2022-07-04 LAB — ECHOCARDIOGRAM COMPLETE
Area-P 1/2: 2.33 cm2
Height: 75 in
S' Lateral: 3.9 cm
Weight: 3403.9 oz

## 2022-07-04 LAB — PROCALCITONIN: Procalcitonin: 0.54 ng/mL

## 2022-07-04 MED ORDER — SODIUM CHLORIDE 0.9% FLUSH
10.0000 mL | Freq: Three times a day (TID) | INTRAVENOUS | Status: DC
  Administered 2022-07-04 – 2022-07-08 (×14): 10 mL via INTRAPLEURAL

## 2022-07-04 MED ORDER — STERILE WATER FOR INJECTION IJ SOLN
5.0000 mg | Freq: Once | RESPIRATORY_TRACT | Status: AC
  Administered 2022-07-04: 5 mg via INTRAPLEURAL
  Filled 2022-07-04: qty 5

## 2022-07-04 MED ORDER — HYDROMORPHONE HCL 1 MG/ML IJ SOLN
0.5000 mg | INTRAMUSCULAR | Status: DC | PRN
  Administered 2022-07-04 – 2022-07-07 (×14): 0.5 mg via INTRAVENOUS
  Filled 2022-07-04 (×14): qty 0.5

## 2022-07-04 MED ORDER — SODIUM CHLORIDE (PF) 0.9 % IJ SOLN
10.0000 mg | Freq: Once | INTRAMUSCULAR | Status: AC
  Administered 2022-07-04: 10 mg via INTRAPLEURAL
  Filled 2022-07-04: qty 10

## 2022-07-04 NOTE — Progress Notes (Signed)
Echocardiogram 2D Echocardiogram has been performed.  Oneal Deputy Benita Boonstra RDCS 07/04/2022, 9:10 AM

## 2022-07-04 NOTE — Assessment & Plan Note (Signed)
S/p chest tube placement by pulmonary with significant drainage of pustular discharge-cultures growing strep intermedius-pending susceptibility  MRSA PCR negative.   Preliminary culture results from [facility] negative for blood and urine, sputum cultures with some yeast and [facility] Infectious disease was also consulted. Vancomycin was discontinued and patient will continue on Zosyn-will be de-escalated once susceptibilities available. Pulmonary also injected fibrinolytic today. -Follow-up labs and culture results from chest tube -Patient will need a repeat CT once drainage ceases.

## 2022-07-04 NOTE — Procedures (Signed)
Pleural Fibrinolytic Administration Procedure Note  [name]  ES:[id number]  [birth year]  Date:07/04/22  Time:11:02 AM   Provider Performing:Jehiel Koepp C Tamala Julian   Procedure: Pleural Fibrinolysis Initial day [phone])  Indication(s) Fibrinolysis of complicated pleural effusion  Consent Risks of the procedure as well as the alternatives and risks of each were explained to the patient and/or caregiver.  Consent for the procedure was obtained.   Anesthesia None   Time Out Verified patient identification, verified procedure, site/side was marked, verified correct patient position, special equipment/implants available, medications/allergies/relevant history reviewed, required imaging and test results available.   Sterile Technique Hand hygiene, gloves   Procedure Description Existing pleural catheter was cleaned and accessed in sterile manner.  75m of tPA in 30cc of saline and 541mof dornase in 30cc of sterile water were injected into pleural space using existing pleural catheter.  Catheter will be clamped for 1 hour and then placed back to suction.   Complications/Tolerance None; patient tolerated the procedure well.  EBL None   Specimen(s) None

## 2022-07-04 NOTE — Progress Notes (Signed)
Progress Note   Patient: [name] [id number] DOB: 01/29/1986 DOA: 07/02/2022     2 DOS: the patient was seen and examined on 07/04/2022   Brief hospital course: Taken from H&P.  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  He was seen by his local PCP.  Chest x-ray demonstrated pleural effusion.  And a white count of to 29,000.  Patient referred to the local emergency department.  CT scan performed there showed a large loculated pleural effusion with empyema.  He also had a cavitary lesion.  Transfer requested from [facility] to [facility] for CT surgery evaluation.   Patient has numerous dental caries.  He states that he has been coughing up thick sputum.  Denies any hemoptysis.   On arrival to [facility], temp 101.4 heart rate 124 blood pressure 136/83 satting 95% on room air.   His CT scan from [facility] was reviewed.  He has a cavitary left upper lobe lesion.  He also has a loculated pleural effusion.  Appearance is consistent with empyema.    ED Course: CT chest showed loculated left sided effusion consistent with empyema. WBC 26K.  2/18: Patient with tachycardia, EKG with sinus tachycardia, no other abnormality. Procalcitonin at 0.57, improving leukocytosis at 20.8.  Hemoglobin at 7.5, iron at 8 and saturation 4% along with low TIBC at 185 which is more consistent with anemia of chronic disease and iron deficiency. Sputum culture done at [facility] with some yeast and gram-positive cocci. Blood and urine cultures negative so far. Pulmonary was consulted and a chest tube was placed with foul-smelling purulent discharge-Labs sent. Infectious disease was also consulted.  2/19: Patient remained afebrile over the past 24 hours.  Exudative pleural fluid with markedly  elevated WBC at 656 500 and LDH more than 10,000, low protein and glucose.  Preliminary cultures growing gram-positive cocci.  Small pneumothorax was noted after chest tube placement Repeat chest x-ray today with left-sided hydropneumothorax.  Fluid component is unchanged from prior exam.  Air component has decreased from prior exam.  Persistent left basilar airspace opacity.  Pulmonary injected fibrinolytic through existing chest tube today. Echocardiogram today was within normal limit.   Assessment and Plan: * Empyema ([place])  - left side S/p chest tube placement by pulmonary with significant drainage of pustular discharge-Labs sent.  MRSA PCR negative.  Preliminary culture results from [facility] negative for blood and urine, sputum cultures with some yeast and GPC Infectious disease was also consulted. Vancomycin was discontinued and patient will continue on Zosyn. Pulmonary also injected fibrinolytic today. -Follow-up labs and culture results from chest tube -Patient will need a repeat CT once drainage ceases.   Pulmonary cavitary lesion - left side Has cavitary lesions seen on CT. Percival Spanish test sent from [place]. Results still pending.  Pulmonary discontinued precautions as less likely TB  Sepsis without acute organ dysfunction ([place]) Due to cavitary pneumonia and empyema. Has WBC of 26K on admission. Fulfills sepsis criteria. Has fever of 101.4 HR 124.  Leukocytosis improving, procalcitonin at 0.57. -Follow-up culture results -Continue Zosyn for now  Loculated pleural effusion See above. .  Hemiparesis affecting right side as late effect of cerebrovascular accident (CVA) ([place]) Chronic. Pt states he can walk on right LE. 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  Tobacco abuse Chronic. -Nicotine patch as needed  Subjective: Patient was complaining of left lateral chest wall pain around the catheter site which increased with deep breathing.  Physical Exam: Vitals:    07/04/22 0703 07/04/22 0850 07/04/22 0851 07/04/22 1419  BP: 126/82     Pulse: 94 92  82  Resp: 15 16  17  $ Temp: 97.6 F (36.4 C)     TempSrc: Oral     SpO2: 97% 98% 98% 99%  Weight:      Height:       General.  Well-developed gentleman, in no acute distress. Pulmonary.  Lungs clear bilaterally, normal respiratory effort. CV.  Regular rate and rhythm, no JVD, rub or murmur. Abdomen.  Soft, nontender, nondistended, BS positive. CNS.  Alert and oriented .RUE with contracture Extremities.  No edema, no cyanosis, pulses intact and symmetrical. Psychiatry.  Judgment and insight appears normal.   Data Reviewed: Prior data reviewed  Family Communication: Discussed with patient Disposition: Status is: Inpatient Remains inpatient appropriate because: Severity of illness  Planned Discharge Destination: Home  Time spent: 45 minutes  This record has been created using Systems analyst. Errors have been sought and corrected,but may not always be located. Such creation errors do not reflect on the standard of care.   Author: Lorella Nimrod, MD 07/04/2022 3:30 PM  For on call review [email or web address].

## 2022-07-04 NOTE — Progress Notes (Signed)
  Transition of Care [facility]) Screening Note   Patient Details  Name: [name] Date of Birth: 03/15/1986   Transition of Care [facility]) CM/SW Contact:    Cyndi Bender, RN Phone Number: 07/04/2022, 2:17 PM    Transition of Care Department [facility]) has reviewed patient and no TOC needs have been identified at this time. We will continue to monitor patient advancement through interdisciplinary progression rounds. If new patient transition needs arise, please place a TOC consult.

## 2022-07-04 NOTE — Progress Notes (Signed)
    [facility] for Infectious Disease   Reason for visit: Follow up on empyema  Interval History: complaint of pain; wanting to bathe; WBC down to 11.4, no fever about 24 hours   Physical Exam: Constitutional:  Vitals:   07/04/22 0850 07/04/22 0851  BP:    Pulse: 92   Resp: 16   Temp:    SpO2: 98% 98%   patient appears in NAD Respiratory: Normal respiratory effort  Review of Systems: Constitutional: negative for fevers and chills  Lab Results  Component Value Date   WBC 11.4 (H) 07/04/2022   HGB 8.1 (L) 07/04/2022   HCT 26.5 (L) 07/04/2022   MCV 78.6 (L) 07/04/2022   PLT 674 (H) 07/04/2022    Lab Results  Component Value Date   CREATININE 0.99 07/03/2022   BUN 10 07/03/2022   NA 131 (L) 07/03/2022   K 3.6 07/03/2022   CL 101 07/03/2022   CO2 20 (L) 07/03/2022    Lab Results  Component Value Date   ALT 13 07/03/2022   AST 12 (L) 07/03/2022   ALKPHOS 101 07/03/2022     Microbiology: Recent Results (from the past 240 hour(s))  MRSA Next Gen by PCR, Nasal     Status: None   Collection Time: 07/02/22  8:10 PM   Specimen: Nasal Mucosa; Nasal Swab  Result Value Ref Range Status   MRSA by PCR Next Gen NOT DETECTED NOT DETECTED Final    Comment: (NOTE) The GeneXpert MRSA Assay (FDA approved for NASAL specimens only), is one component of a comprehensive MRSA colonization surveillance program. It is not intended to diagnose MRSA infection nor to guide or monitor treatment for MRSA infections. Test performance is not FDA approved in patients less than 62 years old. Performed at [facility], [place] [address] [place], [place] [address]   Body fluid culture w Gram Stain     Status: None (Preliminary result)   Collection Time: 07/03/22  9:05 AM   Specimen: Pleural Fluid  Result Value Ref Range Status   Specimen Description PLEURAL  Final   Special Requests LEFT  Final   Gram Stain   Final    ABUNDANT WBC PRESENT, PREDOMINANTLY PMN FEW GRAM POSITIVE  COCCI Gram Stain Report Called to,Read Back By and Verified With:  Margaretmary Dys, RN 07/03/22 2045 A. LAFRANCE    Culture   Final    CULTURE REINCUBATED FOR BETTER GROWTH Performed at [facility], [place] [address] [place], [place] [address]    Report Status PENDING  Incomplete    Impression/Plan:  1. Left-sided empyema - no growth on cultures to date.  On piperacillin/tazobactam.  Will need prolonged antibiotics and will narrow based on growth.    2.  Fever - fever curve trending down.  Will continue to monitor.   3.  Leukocytosis - WBC trending down.  Will continue to monitor

## 2022-07-04 NOTE — Assessment & Plan Note (Signed)
See above

## 2022-07-04 NOTE — Progress Notes (Signed)
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  Went to [place] found to have cavitary PNA + empyema so sent to [facility].  Seen in [place], on RA, in good spirits.  Pertinent  Medical History   Past Medical History:  Diagnosis Date   Asthma    Cerebral thrombosis with cerebral infarction ([employer])    Headache(784.0)    Spastic hemiplegia affecting dominant side ([place])    Stroke ([place])    TBI (traumatic brain injury) ([place])      Significant Hospital Events: Including procedures, antibiotic start and stop dates in addition to other pertinent events   2/17 admit  Interim History / Subjective:  Some pain at chest tube site. Breathing overall improved.  Objective   Blood pressure 126/82, pulse 92, temperature 97.6 F (36.4 C), temperature source Oral, resp. rate 16, height 6' 3"$  (1.905 m), weight 96.5 kg, SpO2 98 %.        Intake/Output Summary (Last 24 hours) at 07/04/2022 1102 Last data filed at 07/04/2022 [id number] Gross per 24 hour  Intake 1644.87 ml  Output 2180 ml  Net -535.13 ml    Filed Weights   07/02/22 2007 07/04/22 0605  Weight: 101.4 kg 96.5 kg    Examination: No distress Pus in atrium Lung sounds improved but still diminished on R No wheezing on my exam Ext warm Heart sounds regular Stable RUE paresis  CXR improved, possible trapped lung and still some probable fluid  GPCs in fluid  Resolved Hospital Problem list   N/A  Assessment & Plan:  Necrotic pneumonia with empyema formation- probably from teeth.  No risk factors nor is imaging c/w TB. - Lytics day 1 - [place] to -20cmH2O - Tailor abx to culture  data - Pain control  Best Practice (right click and "Reselect all SmartList Selections" daily)  Per primary  Erskine Emery MD PCCM

## 2022-07-05 ENCOUNTER — Inpatient Hospital Stay (HOSPITAL_COMMUNITY): Payer: 59

## 2022-07-05 DIAGNOSIS — J869 Pyothorax without fistula: Secondary | ICD-10-CM | POA: Diagnosis not present

## 2022-07-05 DIAGNOSIS — J984 Other disorders of lung: Secondary | ICD-10-CM | POA: Diagnosis not present

## 2022-07-05 DIAGNOSIS — A419 Sepsis, unspecified organism: Secondary | ICD-10-CM | POA: Diagnosis not present

## 2022-07-05 DIAGNOSIS — J90 Pleural effusion, not elsewhere classified: Secondary | ICD-10-CM | POA: Diagnosis not present

## 2022-07-05 DIAGNOSIS — F17210 Nicotine dependence, cigarettes, uncomplicated: Secondary | ICD-10-CM | POA: Diagnosis not present

## 2022-07-05 LAB — CYTOLOGY - NON PAP

## 2022-07-05 NOTE — Progress Notes (Signed)
Progress Note   Patient: [name] [id number] DOB: 25-Jan-1986 DOA: 07/02/2022     3 DOS: the patient was seen and examined on 07/05/2022   Brief hospital course: Taken from H&P.  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  He was seen by his local PCP.  Chest x-ray demonstrated pleural effusion.  And a white count of to 29,000.  Patient referred to the local emergency department.  CT scan performed there showed a large loculated pleural effusion with empyema.  He also had a cavitary lesion.  Transfer requested from [facility] to [facility] for CT surgery evaluation.   Patient has numerous dental caries.  He states that he has been coughing up thick sputum.  Denies any hemoptysis.   On arrival to [facility], temp 101.4 heart rate 124 blood pressure 136/83 satting 95% on room air.   His CT scan from [facility] was reviewed.  He has a cavitary left upper lobe lesion.  He also has a loculated pleural effusion.  Appearance is consistent with empyema.    ED Course: CT chest showed loculated left sided effusion consistent with empyema. WBC 26K.  2/18: Patient with tachycardia, EKG with sinus tachycardia, no other abnormality. Procalcitonin at 0.57, improving leukocytosis at 20.8.  Hemoglobin at 7.5, iron at 8 and saturation 4% along with low TIBC at 185 which is more consistent with anemia of chronic disease and iron deficiency. Sputum culture done at [facility] with some yeast and gram-positive cocci. Blood and urine cultures negative so far. Pulmonary was consulted and a chest tube was placed with foul-smelling purulent discharge-Labs sent. Infectious disease was also consulted.  2/19: Patient remained afebrile over the past 24 hours.  Exudative pleural fluid with markedly  elevated WBC at 656 500 and LDH more than 10,000, low protein and glucose.  Preliminary cultures growing gram-positive cocci.  Small pneumothorax was noted after chest tube placement Repeat chest x-ray today with left-sided hydropneumothorax.  Fluid component is unchanged from prior exam.  Air component has decreased from prior exam.  Persistent left basilar airspace opacity.  Pulmonary injected fibrinolytic through existing chest tube today. Echocardiogram today was within normal limit.  2/20: Patient remained afebrile with stable vitals.  Pleural fluid cultures with strep intermedius, chest tube output at 330.  Chest x-ray this morning with unchanged left hydropneumothorax with overlying left basilar opacities.   Assessment and Plan: * Empyema ([place])  - left side S/p chest tube placement by pulmonary with significant drainage of pustular discharge-cultures growing strep intermedius-pending susceptibility  MRSA PCR negative.   Preliminary culture results from [facility] negative for blood and urine, sputum cultures with some yeast and GPC Infectious disease was also consulted. Vancomycin was discontinued and patient will continue on Zosyn-will be de-escalated once susceptibilities available. Pulmonary also injected fibrinolytic today. -Follow-up labs and culture results from chest tube -Patient will need a repeat CT once drainage ceases.   Pulmonary cavitary lesion - left side Has cavitary lesions seen on CT. Percival Spanish test sent from [place]. Results still pending.  Pulmonary discontinued precautions as less likely TB  Sepsis without acute organ dysfunction ([place]) Due to cavitary pneumonia and empyema. Has WBC of 26K on admission. Fulfills sepsis criteria. Has fever of 101.4 HR 124.  Leukocytosis improving, procalcitonin at 0.57. -Follow-up culture results -Continue Zosyn for now  Loculated pleural effusion See above. .  Hemiparesis affecting right side as late effect of cerebrovascular  accident (CVA) ([place]) Chronic. Pt states he can walk on right LE. 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  Tobacco abuse Chronic. -Nicotine patch as needed   Subjective: Patient was resting when seen today.  Per patient he continued to have some pain but becoming little tolerable.  Physical Exam: Vitals:   07/05/22 1140 07/05/22 1237 07/05/22 1256 07/05/22 1555  BP: 110/87   (!) 130/103  Pulse: (!) 117 (!) 117 (!) 108 89  Resp: 15 13 15 18  $ Temp: 98.6 F (37 C)   98 F (36.7 C)  TempSrc: Oral   Oral  SpO2: 90% 99% 98% 97%  Weight:      Height:       General.  Well-developed gentleman, in no acute distress. Pulmonary.  Lungs clear bilaterally, normal respiratory effort. CV.  Regular rate and rhythm, no JVD, rub or murmur. Abdomen.  Soft, nontender, nondistended, BS positive. CNS.  Alert and oriented .  RUE contractures Extremities.  No edema, no cyanosis, pulses intact and symmetrical. Psychiatry.  Judgment and insight appears normal.   Data Reviewed: Prior data reviewed  Family Communication: Discussed with patient  Disposition: Status is: Inpatient Remains inpatient appropriate because: Severity of illness  Planned Discharge Destination: Home  Time spent: 44 minutes  This record has been created using Systems analyst. Errors have been sought and corrected,but may not always be located. Such creation errors do not reflect on the standard of care.   Author: Lorella Nimrod, MD 07/05/2022 6:08 PM  For on call review [email or web address].

## 2022-07-05 NOTE — Progress Notes (Signed)
    [facility] for Infectious Disease   Reason for visit: Follow up on empyema  Interval History: afebrile > 48 hours; day 4 piperacillin/tazobactam; no new complaints   Physical Exam: Constitutional:  Vitals:   07/05/22 1237 07/05/22 1256  BP:    Pulse: (!) 117 (!) 108  Resp: 13 15  Temp:    SpO2: 99% 98%   patient appears in NAD Respiratory: Normal respiratory effort  Review of Systems: Constitutional: negative for fevers and chills  Lab Results  Component Value Date   WBC 11.4 (H) 07/04/2022   HGB 8.1 (L) 07/04/2022   HCT 26.5 (L) 07/04/2022   MCV 78.6 (L) 07/04/2022   PLT 674 (H) 07/04/2022    Lab Results  Component Value Date   CREATININE 0.99 07/03/2022   BUN 10 07/03/2022   NA 131 (L) 07/03/2022   K 3.6 07/03/2022   CL 101 07/03/2022   CO2 20 (L) 07/03/2022    Lab Results  Component Value Date   ALT 13 07/03/2022   AST 12 (L) 07/03/2022   ALKPHOS 101 07/03/2022     Microbiology: Recent Results (from the past 240 hour(s))  MRSA Next Gen by PCR, Nasal     Status: None   Collection Time: 07/02/22  8:10 PM   Specimen: Nasal Mucosa; Nasal Swab  Result Value Ref Range Status   MRSA by PCR Next Gen NOT DETECTED NOT DETECTED Final    Comment: (NOTE) The GeneXpert MRSA Assay (FDA approved for NASAL specimens only), is one component of a comprehensive MRSA colonization surveillance program. It is not intended to diagnose MRSA infection nor to guide or monitor treatment for MRSA infections. Test performance is not FDA approved in patients less than 67 years old. Performed at [facility], [place] [address], [place], [place] [address]   Body fluid culture w Gram Stain     Status: None (Preliminary result)   Collection Time: 07/03/22  9:05 AM   Specimen: Pleural Fluid  Result Value Ref Range Status   Specimen Description PLEURAL  Final   Special Requests LEFT  Final   Gram Stain   Final    ABUNDANT WBC PRESENT, PREDOMINANTLY PMN FEW GRAM  POSITIVE COCCI Gram Stain Report Called to,Read Back By and Verified With:  Margaretmary Dys, RN 07/03/22 2045 A. LAFRANCE    Culture   Final    RARE STREPTOCOCCUS INTERMEDIUS SUSCEPTIBILITIES TO FOLLOW Performed at [facility], [place] [address], [place], [place] [address]    Report Status PENDING  Incomplete    Impression/Plan:  1. Left-sided empyema - now growing Strep intermedius.  Sensitivities pending.  At this point, will continue with pip/tazo and change based on sensitivities.   I anticipate prolonged oral therapy at discharge.   2.  Fever - he has remained afebrile since his initial fever.  Will continue to monitor  3.  Medication monitoring - creat has remained stable.  Will continue to monitor periodically.

## 2022-07-05 NOTE — Progress Notes (Signed)
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  Went to [place] found to have cavitary PNA + empyema so sent to [facility].  Seen in [place], on RA, in good spirits.  Pertinent  Medical History   Past Medical History:  Diagnosis Date   Asthma    Cerebral thrombosis with cerebral infarction ([place])    Headache(784.0)    Spastic hemiplegia affecting dominant side ([place])    Stroke ([place])    TBI (traumatic brain injury) ([place])      Significant Hospital Events: Including procedures, antibiotic start and stop dates in addition to other pertinent events   2/17 admit 2/18 chest tube placed 2/19 tube lytics given  Interim History / Subjective:  CXR showing unchanged left hydropneumothorax, chest tube appears to have migrated some. CT outpt 330 last 24 hours On room air Still having some mild pain around chest tube site  Objective   Blood pressure 110/87, pulse (!) 117, temperature 98.6 F (37 C), temperature source Oral, resp. rate 13, height 6' 3"$  (1.905 m), weight 97.6 kg, SpO2 99 %.        Intake/Output Summary (Last 24 hours) at 07/05/2022 1249 Last data filed at 07/05/2022 1212 Gross per 24 hour  Intake 1336.61 ml  Output 2960 ml  Net -1623.39 ml    Filed Weights   07/02/22 2007 07/04/22 0605 07/05/22 0239  Weight: 101.4 kg 96.5 kg 97.6 kg    Examination: General:  NAD HEENT: MM pink/moist Neuro: Aox3; MAE; CV: s1s2, no m/r/g PULM:  dim clear BS bilaterally; on room air; left CT in place with purulent outpt GI: soft, bsx4 active  Extremities: warm/dry, no edema  Skin: no rashes or lesions    Resolved Hospital Problem  list   N/A  Assessment & Plan:  Necrotic pneumonia with empyema formation- probably from teeth.  No risk factors nor is imaging c/w TB. -pleural cultures showing rare strep intermedius Plan: -continue chest tube to -20 suction -monitor CT output -hold on lytics today -CXR in am -pleural cultures w/ rare strep intermedius; continue zosyn; follow susceptibilities   Best Practice (right click and "Reselect all SmartList Selections" daily)  Per primary   JD Geryl Rankins Pulmonary & Critical Care 07/05/2022, 12:55 PM  Please see [email or web address] for pager details.  From 7A-7P if no response, please call [phone]. After hours, please call ELink [phone].

## 2022-07-05 NOTE — Assessment & Plan Note (Signed)
Due to cavitary pneumonia and empyema. Has WBC of 26K on admission. Fulfills sepsis criteria. Has fever of 101.4 HR 124.  Leukocytosis improving, procalcitonin at 0.57. -Follow-up culture results -Continue Zosyn for now

## 2022-07-06 ENCOUNTER — Inpatient Hospital Stay (HOSPITAL_COMMUNITY): Payer: 59

## 2022-07-06 DIAGNOSIS — A419 Sepsis, unspecified organism: Secondary | ICD-10-CM | POA: Diagnosis not present

## 2022-07-06 DIAGNOSIS — J869 Pyothorax without fistula: Secondary | ICD-10-CM | POA: Diagnosis not present

## 2022-07-06 DIAGNOSIS — F17210 Nicotine dependence, cigarettes, uncomplicated: Secondary | ICD-10-CM | POA: Diagnosis not present

## 2022-07-06 DIAGNOSIS — Z720 Tobacco use: Secondary | ICD-10-CM | POA: Diagnosis not present

## 2022-07-06 MED ORDER — ENOXAPARIN SODIUM 40 MG/0.4ML IJ SOSY
40.0000 mg | PREFILLED_SYRINGE | Freq: Every day | INTRAMUSCULAR | Status: DC
  Administered 2022-07-06 – 2022-07-09 (×4): 40 mg via SUBCUTANEOUS
  Filled 2022-07-06 (×4): qty 0.4

## 2022-07-06 NOTE — Care Management Important Message (Signed)
Important Message  Patient Details  Name: [name] MRN: [id number] Date of Birth: February 12, 1986   Medicare Important Message Given:  Yes     [name] Montine Circle 07/06/2022, 9:36 AM

## 2022-07-06 NOTE — [place] Notes (Signed)
PROGRESS NOTE    [name]  [id number] DOB: [birth year] DOA: 07/02/2022 PCP: [name], MD   No chief complaint on file.   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  He was seen by his local PCP.  Chest x-ray demonstrated pleural effusion.  And a white count of to 29,000.  Patient referred to the local emergency department.  CT scan performed there showed a large loculated pleural effusion with empyema.  He also had a cavitary lesion.  Transfer requested from [facility] to [facility] for CT surgery evaluation.   Patient has numerous dental caries.  He states that he has been coughing up thick sputum.  Denies any hemoptysis.   On arrival to [facility], temp 101.4 heart rate 124 blood pressure 136/83 satting 95% on room air.   His CT scan from [facility] was reviewed.  He has a cavitary left upper lobe lesion.  He also has a loculated pleural effusion.  Appearance is consistent with empyema.  -PCCM were consulted, chest tube were inserted, remains with significant output, cultures growing Streptococcus intermedius, chest tube management per PCCM, antibiotics management per ID.  Assessment & Plan:   Principal Problem:   Empyema ([place])  - left side Active Problems:   Loculated pleural effusion   Sepsis without acute organ dysfunction (HCC)   Pulmonary cavitary lesion - left side   Hemiparesis affecting right side as late effect of cerebrovascular accident (CVA) ([place])   Tobacco abuse   Empyema ([place])  - left side - S/p chest tube placement by pulmonary with significant drainage of pustular discharge-cultures growing strep intermedius-pending susceptibility  - MRSA PCR negative.   - Preliminary culture results from [facility] negative for blood  and urine, sputum cultures with some yeast and GPC -ID consulted -Status post fibrinolytics -History of management per PCCM, continue with chest tube to suction -20   Pulmonary cavitary lesion - left side Has cavitary lesions seen on CT. Percival Spanish test sent from [place]. Results still pending.  Pulmonary discontinued precautions as less likely TB   Sepsis without acute organ dysfunction ([place]) Due to cavitary pneumonia and empyema. Has WBC of 26K on admission. Fulfills sepsis criteria. Has fever of 101.4 HR 124.  Leukocytosis improving, procalcitonin at 0.57. -Follow-up culture results -Continue Zosyn for now   Loculated pleural effusion See above. .   Hemiparesis affecting right side as late effect of cerebrovascular accident (CVA) ([place]) Chronic. Pt states he can walk on right LE. 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   Tobacco abuse Chronic. -Nicotine patch as needed        DVT prophylaxis: Lovenox Code Status: Full Family Communication: None at bedside Disposition:   Status is: Inpatient    Consultants:  ID PCCM   Subjective:  No significant events overnight, reported pain is more controlled, he had 270 cc output from chest tube.      Objective: Vitals:   07/06/22 0343 07/06/22 0500 07/06/22 0813 07/06/22 0825  BP: 135/83  (!) 131/90   Pulse: 85  90   Resp: 14  15   Temp: 98 F (36.7 C)  98 F (36.7 C)   TempSrc: Oral  Oral   SpO2: 98%  100% 100%  Weight:  96.9 kg    Height:  Intake/Output Summary (Last 24 hours) at 07/06/2022 0920 Last data filed at 07/06/2022 0800 Gross per 24 hour  Intake 490 ml  Output 3945 ml  Net -3455 ml   Filed Weights   07/04/22 0605 07/05/22 0239 07/06/22 0500  Weight: 96.5 kg 97.6 kg 96.9 kg    Examination:   Awake Alert, Oriented X 3, No new F.N deficits, Normal affect, right upper extremity chronically contracted Symmetrical Chest wall movement, Good air movement bilaterally, left-sided chest  tube. RRR,No Gallops,Rubs or new Murmurs, No Parasternal Heave +ve B.Sounds, Abd Soft, No tenderness, No rebound - guarding or rigidity. No Cyanosis, Clubbing or edema, No new Rash or bruise     Data Reviewed: I have personally reviewed following labs and imaging studies  CBC: Recent Labs  Lab 07/02/22 2119 07/03/22 0030 07/04/22 0028  WBC 21.2* 20.8* 11.4*  NEUTROABS 16.7* 16.0* 7.3  HGB 7.9* 7.5* 8.1*  HCT 26.1* 24.2* 26.5*  MCV 78.4* 77.1* 78.6*  PLT 724* 651* 674*    Basic Metabolic Panel: Recent Labs  Lab 07/02/22 2119 07/03/22 0030  NA 129* 131*  K 3.5 3.6  CL 99 101  CO2 21* 20*  GLUCOSE 99 104*  BUN 10 10  CREATININE 0.98 0.99  CALCIUM 7.8* 7.9*    GFR: Estimated Creatinine Clearance: 124.5 mL/min (by C-G formula based on SCr of 0.99 mg/dL).  Liver Function Tests: Recent Labs  Lab 07/02/22 2119 07/03/22 0030  AST 13* 12*  ALT 12 13  ALKPHOS 119 101  BILITOT 0.2* 0.8  PROT 6.7 6.5  ALBUMIN 1.9* 1.8*    CBG: No results for input(s): "GLUCAP" in the last 168 hours.   Recent Results (from the past 240 hour(s))  MRSA Next Gen by PCR, Nasal     Status: None   Collection Time: 07/02/22  8:10 PM   Specimen: Nasal Mucosa; Nasal Swab  Result Value Ref Range Status   MRSA by PCR Next Gen NOT DETECTED NOT DETECTED Final    Comment: (NOTE) The GeneXpert MRSA Assay (FDA approved for NASAL specimens only), is one component of a comprehensive MRSA colonization surveillance program. It is not intended to diagnose MRSA infection nor to guide or monitor treatment for MRSA infections. Test performance is not FDA approved in patients less than 61 years old. Performed at [facility], [place] [address], [place], [place] [address]   Body fluid culture w Gram Stain     Status: None (Preliminary result)   Collection Time: 07/03/22  9:05 AM   Specimen: Pleural Fluid  Result Value Ref Range Status   Specimen Description PLEURAL  Final   Special Requests  LEFT  Final   Gram Stain   Final    ABUNDANT WBC PRESENT, PREDOMINANTLY PMN FEW GRAM POSITIVE COCCI Gram Stain Report Called to,Read Back By and Verified With:  Margaretmary Dys, RN 07/03/22 2045 A. LAFRANCE    Culture   Final    RARE STREPTOCOCCUS INTERMEDIUS SUSCEPTIBILITIES TO FOLLOW Performed at [facility], [place] [address] [place], [place] [address]    Report Status PENDING  Incomplete         Radiology Studies: DG CHEST PORT 1 VIEW  Result Date: 07/06/2022 CLINICAL DATA:  Empyema.  Chest drainage catheter in place. EXAM: PORTABLE CHEST 1 VIEW COMPARISON:  Single-view of the chest 07/05/2022. FINDINGS: Pigtail catheter remains in place in the lower left chest. The catheter has backed out slightly. Hazy opacity over the lower left chest consistent with  small effusion and airspace disease persist. Small left basilar pneumothorax again seen. The right lung is expanded and clear. Heart size is normal. IMPRESSION: The patient's left chest tube has backed out slightly. Small left hydropneumothorax and basilar airspace disease are unchanged. Electronically Signed   By: Inge Rise M.D.   On: 07/06/2022 08:08   DG Chest Port 1 View  Result Date: 07/05/2022 CLINICAL DATA:  History of ETT EXAM: PORTABLE CHEST 1 VIEW COMPARISON:  Chest x-ray July 04, 2022. FINDINGS: Similar left pleural effusion with overlying left basilar opacities. Left pleural pigtail catheter in place. Similar small air component, compatible with hydropneumothorax. Cardiomediastinal silhouette is unchanged. Right lung is clear. No acute osseous abnormality. Small amount of subcutaneous emphysema in the left chest wall. IMPRESSION: Unchanged left hydropneumothorax with overlying left basilar opacities. Electronically Signed   By: Margaretha Sheffield M.D.   On: 07/05/2022 08:28        Scheduled Meds:  arformoterol  15 mcg Nebulization BID   baclofen  20 mg Oral TID   budesonide (PULMICORT) nebulizer solution   0.25 mg Nebulization BID   sodium chloride flush  10 mL Intrapleural Q8H   Continuous Infusions:  piperacillin-tazobactam (ZOSYN)  IV 3.375 g (07/06/22 0537)     LOS: 4 days      Phillips Climes, MD [facility]   To contact the attending provider between 7A-7P or the covering provider during after hours 7P-7A, please log into the web site [email or web address] and access using universal [place] password for that web site. If you do not have the password, please call the hospital operator.  07/06/2022, 9:20 AM

## 2022-07-06 NOTE — [place] Notes (Incomplete)
[place] Note   Patient: [name] [id number] DOB: 06-17-1985 DOA: 07/02/2022     4 DOS: the patient was seen and examined on 07/06/2022   Brief hospital course: Taken from H&P.  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  He was seen by his local PCP.  Chest x-ray demonstrated pleural effusion.  And a white count of to 29,000.  Patient referred to the local emergency department.  CT scan performed there showed a large loculated pleural effusion with empyema.  He also had a cavitary lesion.  Transfer requested from [facility] to [facility] for CT surgery evaluation.   Patient has numerous dental caries.  He states that he has been coughing up thick sputum.  Denies any hemoptysis.   On arrival to [facility], temp 101.4 heart rate 124 blood pressure 136/83 satting 95% on room air.   His CT scan from [facility] was reviewed.  He has a cavitary left upper lobe lesion.  He also has a loculated pleural effusion.  Appearance is consistent with empyema.    ED Course: CT chest showed loculated left sided effusion consistent with empyema. WBC 26K.  2/18: Patient with tachycardia, EKG with sinus tachycardia, no other abnormality. Procalcitonin at 0.57, improving leukocytosis at 20.8.  Hemoglobin at 7.5, iron at 8 and saturation 4% along with low TIBC at 185 which is more consistent with anemia of chronic disease and iron deficiency. Sputum culture done at [facility] with some yeast and gram-positive cocci. Blood and urine cultures negative so far. Pulmonary was consulted and a chest tube was placed with foul-smelling purulent discharge-Labs sent. Infectious disease was also consulted.  2/19: Patient remained afebrile over the past 24 hours.  Exudative pleural fluid with markedly  elevated WBC at 656 500 and LDH more than 10,000, low protein and glucose.  Preliminary cultures growing gram-positive cocci.  Small pneumothorax was noted after chest tube placement Repeat chest x-ray today with left-sided hydropneumothorax.  Fluid component is unchanged from prior exam.  Air component has decreased from prior exam.  Persistent left basilar airspace opacity.  Pulmonary injected fibrinolytic through existing chest tube today. Echocardiogram today was within normal limit.  2/20: Patient remained afebrile with stable vitals.  Pleural fluid cultures with strep intermedius, chest tube output at 330.  Chest x-ray this morning with unchanged left hydropneumothorax with overlying left basilar opacities.   Assessment and Plan: * Empyema ([place])  - left side S/p chest tube placement by pulmonary with significant drainage of pustular discharge-cultures growing strep intermedius-pending susceptibility  MRSA PCR negative.   Preliminary culture results from [facility] negative for blood and urine, sputum cultures with some yeast and GPC Infectious disease was also consulted. Vancomycin was discontinued and patient will continue on Zosyn-will be de-escalated once susceptibilities available. Pulmonary also injected fibrinolytic today. -Follow-up labs and culture results from chest tube -Patient will need a repeat CT once drainage ceases.   Pulmonary cavitary lesion - left side Has cavitary lesions seen on CT. Percival Spanish test sent from [place]. Results still pending.  Pulmonary discontinued precautions as less likely TB  Sepsis without acute organ dysfunction ([place]) Due to cavitary pneumonia and empyema. Has WBC of 26K on admission. Fulfills sepsis criteria. Has fever of 101.4 HR 124.  Leukocytosis improving, procalcitonin at 0.57. -Follow-up culture results -Continue Zosyn for now  Loculated pleural effusion See above. .  Hemiparesis affecting right side as late effect of cerebrovascular  accident (CVA) ([place]) Chronic. Pt states he can walk on right LE. 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  Tobacco abuse Chronic. -Nicotine patch as needed   Subjective:   No significant events overnight, reported pain is more controlled, he had 270 cc output from chest tube.    Physical Exam: Vitals:   07/06/22 0343 07/06/22 0500 07/06/22 0813 07/06/22 0825  BP: 135/83  (!) 131/90   Pulse: 85  90   Resp: 14  15   Temp: 98 F (36.7 C)  98 F (36.7 C)   TempSrc: Oral  Oral   SpO2: 98%  100% 100%  Weight:  96.9 kg    Height:        Awake Alert, Oriented X 3, No new F.N deficits, Normal affect, right upper extremity chronically contracted Symmetrical Chest wall movement, Good air movement bilaterally, left-sided chest tube. RRR,No Gallops,Rubs or new Murmurs, No Parasternal Heave +ve B.Sounds, Abd Soft, No tenderness, No rebound - guarding or rigidity. No Cyanosis, Clubbing or edema, No new Rash or bruise     Data Reviewed: Prior data reviewed  Family Communication: Discussed with patient  Disposition: Status is: Inpatient Remains inpatient appropriate because: Severity of illness  Planned Discharge Destination: Home   This record has been created using Systems analyst. Errors have been sought and corrected,but may not always be located. Such creation errors do not reflect on the standard of care.   Author: Phillips Climes, MD 07/06/2022 9:17 AM  For on call review [email or web address].

## 2022-07-06 NOTE — [place] Notes (Signed)
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  Went to [place] found to have cavitary PNA + empyema so sent to [facility].  Seen in [place], on RA, in good spirits.  Pertinent  Medical History   Past Medical History:  Diagnosis Date   Asthma    Cerebral thrombosis with cerebral infarction ([place])    Headache(784.0)    Spastic hemiplegia affecting dominant side ([place])    Stroke ([place])    TBI (traumatic brain injury) ([place])      Significant Hospital Events: Including procedures, antibiotic start and stop dates in addition to other pertinent events   2/17 admit 2/18 chest tube placed 2/19 tube lytics given  Interim History / Subjective:  270cc output from chest tube. Feels good. Breathing comfortably.  Objective   Blood pressure (!) 131/90, pulse 90, temperature 98 F (36.7 C), temperature source Oral, resp. rate 15, height 6' 3"$  (1.905 m), weight 96.9 kg, SpO2 100 %.        Intake/Output Summary (Last 24 hours) at 07/06/2022 0837 Last data filed at 07/06/2022 0800 Gross per 24 hour  Intake 490 ml  Output 3945 ml  Net -3455 ml    Filed Weights   07/04/22 0605 07/05/22 0239 07/06/22 0500  Weight: 96.5 kg 97.6 kg 96.9 kg    Examination: General: Adult male, resting in bed, in NAD. Neuro: A&O x 3, no deficits. HEENT: /AT. Sclerae anicteric. EOMI. Cardiovascular: RRR, no M/R/G.  Lungs: Respirations even and unlabored.  CTA bilaterally, No W/R/R. Left chest tube with purulent output, 270cc last 24 hours. Abdomen: BS x 4, soft, NT/ND.  Musculoskeletal: No gross deformities, no edema.  Skin: Intact, warm, no  rashes.   Assessment & Plan:   Necrotic pneumonia with empyema formation - probably from teeth.  No risk factors nor is imaging c/w TB. S/p left chest tube placement, tube somewhat migrated. Pleural cultures growing strep intermedius. - Continue chest tube to -20 suction. - Monitor CT output daily. - Continue Zosyn. - CXR in AM.  Rest per primary team.  Montey Hora, Amo For pager details, please see AMION or use Epic chat  After 1900, please call [facility] for cross coverage needs 07/06/2022, 8:40 AM

## 2022-07-06 NOTE — [place] Notes (Signed)
    [facility] for Infectious Disease   Reason for visit: Follow up on empyema  Interval History: continues to be afebrile with no new complaints.  No rash or diarrhea.    Physical Exam: Constitutional:  Vitals:   07/06/22 0813 07/06/22 0825  BP: (!) 131/90   Pulse: 90   Resp: 15   Temp: 98 F (36.7 C)   SpO2: 100% 100%   patient appears in NAD Respiratory: normal respiratory effort  Review of Systems: Constitutional: negative for fever or chills.   Lab Results  Component Value Date   WBC 11.4 (H) 07/04/2022   HGB 8.1 (L) 07/04/2022   HCT 26.5 (L) 07/04/2022   MCV 78.6 (L) 07/04/2022   PLT 674 (H) 07/04/2022    Lab Results  Component Value Date   CREATININE 0.99 07/03/2022   BUN 10 07/03/2022   NA 131 (L) 07/03/2022   K 3.6 07/03/2022   CL 101 07/03/2022   CO2 20 (L) 07/03/2022    Lab Results  Component Value Date   ALT 13 07/03/2022   AST 12 (L) 07/03/2022   ALKPHOS 101 07/03/2022     Microbiology: Recent Results (from the past 240 hour(s))  MRSA Next Gen by PCR, Nasal     Status: None   Collection Time: 07/02/22  8:10 PM   Specimen: Nasal Mucosa; Nasal Swab  Result Value Ref Range Status   MRSA by PCR Next Gen NOT DETECTED NOT DETECTED Final    Comment: (NOTE) The GeneXpert MRSA Assay (FDA approved for NASAL specimens only), is one component of a comprehensive MRSA colonization surveillance program. It is not intended to diagnose MRSA infection nor to guide or monitor treatment for MRSA infections. Test performance is not FDA approved in patients less than 46 years old. Performed at [facility], [place] [address] [place], [place] [address]   Body fluid culture w Gram Stain     Status: None (Preliminary result)   Collection Time: 07/03/22  9:05 AM   Specimen: Pleural Fluid  Result Value Ref Range Status   Specimen Description PLEURAL  Final   Special Requests LEFT  Final   Gram Stain   Final    ABUNDANT WBC PRESENT, PREDOMINANTLY  PMN FEW GRAM POSITIVE COCCI Gram Stain Report Called to,Read Back By and Verified With:  Margaretmary Dys, RN 07/03/22 2045 A. LAFRANCE    Culture   Final    RARE STREPTOCOCCUS INTERMEDIUS SUSCEPTIBILITIES TO FOLLOW Performed at [facility], [place] [address], [place], [place] [address]    Report Status PENDING  Incomplete    Impression/Plan:  1. Left-sided empyema - culture with Strep intermedius with sensitivities pending. He remains on piperacillin/tazobactam.  At this point will transition to ampicillin/sulbactam and will be discharged on prolonged Augmentin, when ready.    2.  Poor dentition - should have a dental evaluation after discharge.    3.  Medication monitoring - will continue to monitor CBC, bmp

## 2022-07-07 ENCOUNTER — Inpatient Hospital Stay (HOSPITAL_COMMUNITY): Payer: 59

## 2022-07-07 DIAGNOSIS — J869 Pyothorax without fistula: Secondary | ICD-10-CM | POA: Diagnosis not present

## 2022-07-07 LAB — BASIC METABOLIC PANEL
Anion gap: 7 (ref 5–15)
BUN: <5 mg/dL — ABNORMAL LOW (ref 6–20)
CO2: 27 mmol/L (ref 22–32)
Calcium: 8.5 mg/dL — ABNORMAL LOW (ref 8.9–10.3)
Chloride: 101 mmol/L (ref 98–111)
Creatinine, Ser: 0.9 mg/dL (ref 0.61–1.24)
GFR, Estimated: >60 mL/min (ref >60)
Glucose, Bld: 101 mg/dL — ABNORMAL HIGH (ref 70–99)
Potassium: 4.1 mmol/L (ref 3.5–5.1)
Sodium: 135 mmol/L (ref 135–145)

## 2022-07-07 LAB — CBC
HCT: 32 % — ABNORMAL LOW (ref 39.0–52.0)
Hemoglobin: 9.6 g/dL — ABNORMAL LOW (ref 13.0–17.0)
MCH: 23.7 pg — ABNORMAL LOW (ref 26.0–34.0)
MCHC: 30 g/dL (ref 30.0–36.0)
MCV: 79 fL — ABNORMAL LOW (ref 80.0–100.0)
Platelets: 1055 10*3/uL (ref 150–400)
RBC: 4.05 MIL/uL — ABNORMAL LOW (ref 4.22–5.81)
RDW: 18 % — ABNORMAL HIGH (ref 11.5–15.5)
WBC: 11.8 10*3/uL — ABNORMAL HIGH (ref 4.0–10.5)
nRBC: 0 % (ref 0.0–0.2)

## 2022-07-07 MED ORDER — LIDOCAINE 5 % EX PTCH
1.0000 | MEDICATED_PATCH | CUTANEOUS | Status: DC
  Administered 2022-07-07: 1 via TRANSDERMAL
  Filled 2022-07-07 (×3): qty 1

## 2022-07-07 MED ORDER — HYDROCORTISONE 1 % EX CREA
TOPICAL_CREAM | Freq: Two times a day (BID) | CUTANEOUS | Status: AC
  Filled 2022-07-07: qty 28

## 2022-07-07 MED ORDER — OXYCODONE HCL 5 MG PO TABS
5.0000 mg | ORAL_TABLET | ORAL | Status: DC | PRN
  Administered 2022-07-07 – 2022-07-08 (×7): 10 mg via ORAL
  Filled 2022-07-07 (×7): qty 2

## 2022-07-07 MED ORDER — ASPIRIN 81 MG PO TBEC
81.0000 mg | DELAYED_RELEASE_TABLET | Freq: Every day | ORAL | Status: DC
  Administered 2022-07-07 – 2022-07-09 (×3): 81 mg via ORAL
  Filled 2022-07-07 (×3): qty 1

## 2022-07-07 MED ORDER — HYDROMORPHONE HCL 1 MG/ML IJ SOLN
0.5000 mg | INTRAMUSCULAR | Status: DC | PRN
  Administered 2022-07-07 (×2): 0.5 mg via INTRAVENOUS
  Filled 2022-07-07 (×2): qty 0.5

## 2022-07-07 NOTE — [place] Notes (Signed)
Patient refused to have a standing weight taken.

## 2022-07-07 NOTE — Evaluation (Signed)
Physical Therapy Evaluation Patient Details Name: [name] MRN: [id number] DOB: [birth year] Today's Date: 07/07/2022  History of Present Illness  37 yo male admitted 2/17 with SOB, left empyema and sepsis. 2/18 chest tube placed. 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 Pt reports no falls but limited gait distance at baseline. Pt with functional mobility that of baseline and does not require further acute therapy, will sign off with pt aware and agreeable. Encouraged daily ambulation with nursing/mobility assist.   HR max 150 with gait       Recommendations for follow up therapy are one component of a multi-disciplinary discharge planning process, led by the attending physician.  Recommendations may be updated based on patient status, additional functional criteria and insurance authorization.  Follow Up Recommendations No PT follow up      Assistance Recommended at Discharge PRN  Patient can return home with the following  Assistance with cooking/housework;Assist for transportation    Equipment Recommendations None recommended by PT  Recommendations for Other Services       Functional Status Assessment Patient has not had a recent decline in their functional status     Precautions / Restrictions Precautions Precautions: Fall;Other (comment) Precaution Comments: watch HR      Mobility  Bed Mobility Overal bed mobility: Modified Independent                  Transfers Overall transfer level: Modified independent                      Ambulation/Gait Ambulation/Gait assistance: Supervision Gait Distance (Feet): 200 Feet Assistive device: None Gait Pattern/deviations: Step-to pattern, Decreased dorsiflexion - right   Gait velocity interpretation: 1.31 - 2.62 ft/sec, indicative of  limited community ambulator   General Gait Details: pt with hemiparetic gait with slight vaulting and foot drop on right. Pt with steady gait and assist for lines and chest tube  Stairs            Wheelchair Mobility    Modified Rankin (Stroke Patients Only)       Balance Overall balance assessment: Mild deficits observed, not formally tested                                           Pertinent Vitals/Pain Pain Assessment Pain Assessment: 0-10 Pain Score: 5  Pain Location: chest tube Pain Descriptors / Indicators: Aching Pain Intervention(s): Limited activity within patient's tolerance, Repositioned, Monitored during session    Home Living Family/patient expects to be discharged to:: Private residence Living Arrangements: Parent Available Help at Discharge: Family;Available 24 hours/day Type of Home: House Home Access: Ramped entrance       Home Layout: One level Home Equipment: Conservation officer, nature (2 wheels)      Prior Function Prior Level of Function : Needs assist             Mobility Comments: pt performs ADLs and walking without assist. Pt normally only walks household distances ADLs Comments: mom drives, cooks, Development worker, international aid Dominance        Extremity/Trunk Assessment   Upper Extremity Assessment Upper Extremity Assessment: RUE deficits/detail RUE Deficits / Details: spastic hemiplegia, had a brace but  never wears it    Lower Extremity Assessment Lower Extremity Assessment: RLE deficits/detail RLE Deficits / Details: baseline weakness from hemiparesis with drop foot    Cervical / Trunk Assessment Cervical / Trunk Assessment: Normal  Communication   Communication: No difficulties  Cognition Arousal/Alertness: Awake/alert Behavior During Therapy: WFL for tasks assessed/performed Overall Cognitive Status: Within Functional Limits for tasks assessed                                          General  Comments      Exercises     Assessment/Plan    PT Assessment Patient does not need any further PT services  PT Problem List         PT Treatment Interventions      PT Goals (Current goals can be found in the Care Plan section)  Acute Rehab PT Goals PT Goal Formulation: All assessment and education complete, DC therapy    Frequency       Co-evaluation               AM-PAC PT "6 Clicks" Mobility  Outcome Measure Help needed turning from your back to your side while in a flat bed without using bedrails?: None Help needed moving from lying on your back to sitting on the side of a flat bed without using bedrails?: None Help needed moving to and from a bed to a chair (including a wheelchair)?: None Help needed standing up from a chair using your arms (e.g., wheelchair or bedside chair)?: A Little Help needed to walk in hospital room?: A Little Help needed climbing 3-5 steps with a railing? : A Little 6 Click Score: 21    End of Session Equipment Utilized During Treatment: Gait belt Activity Tolerance: Patient tolerated treatment well Patient left: in chair;with call bell/phone within reach;with nursing/sitter in room Nurse Communication: Mobility status PT Visit Diagnosis: Other abnormalities of gait and mobility (R26.89)    Time: [id number] PT Time Calculation (min) (ACUTE ONLY): 16 min   Charges:   PT Evaluation $PT Eval Low Complexity: 1 Low          Adon Gehlhausen P, PT Acute Rehabilitation Services Office: [place] Subrina Vecchiarelli 07/07/2022, 9:10 AM

## 2022-07-07 NOTE — Plan of Care (Signed)
  Problem: Health Behavior/Discharge Planning: Goal: Ability to manage health-related needs will improve Outcome: Progressing   Problem: Clinical Measurements: Goal: Ability to maintain clinical measurements within normal limits will improve Outcome: Progressing Goal: Will remain free from infection Outcome: Progressing Goal: Diagnostic test results will improve Outcome: Progressing Goal: Respiratory complications will improve Outcome: Progressing Goal: Cardiovascular complication will be avoided Outcome: Progressing   Problem: Activity: Goal: Risk for activity intolerance will decrease Outcome: Progressing   Problem: Nutrition: Goal: Adequate nutrition will be maintained Outcome: Progressing   Problem: Coping: Goal: Level of anxiety will decrease Outcome: Progressing   Problem: Elimination: Goal: Will not experience complications related to bowel motility Outcome: Progressing Goal: Will not experience complications related to urinary retention Outcome: Progressing   Problem: Pain Managment: Goal: General experience of comfort will improve Outcome: Progressing   Problem: Safety: Goal: Ability to remain free from injury will improve Outcome: Progressing   Problem: Skin Integrity: Goal: Risk for impaired skin integrity will decrease Outcome: Progressing   Problem: Activity: Goal: Ability to tolerate increased activity will improve Outcome: Progressing   Problem: Clinical Measurements: Goal: Ability to maintain a body temperature in the normal range will improve Outcome: Progressing   Problem: Respiratory: Goal: Ability to maintain adequate ventilation will improve Outcome: Progressing Goal: Ability to maintain a clear airway will improve Outcome: Progressing

## 2022-07-07 NOTE — Plan of Care (Signed)
  Problem: Health Behavior/Discharge Planning: Goal: Ability to manage health-related needs will improve Outcome: Progressing   Problem: Clinical Measurements: Goal: Will remain free from infection Outcome: Progressing Goal: Diagnostic test results will improve Outcome: Progressing Goal: Respiratory complications will improve Outcome: Progressing Goal: Cardiovascular complication will be avoided Outcome: Progressing   Problem: Activity: Goal: Risk for activity intolerance will decrease Outcome: Progressing   Problem: Nutrition: Goal: Adequate nutrition will be maintained Outcome: Progressing   Problem: Coping: Goal: Level of anxiety will decrease Outcome: Progressing

## 2022-07-07 NOTE — Evaluation (Addendum)
Clinical/Bedside Swallow Evaluation Patient Details  Name: [name] MRN: [id number] Date of Birth: March 21, 1986  Today's Date: 07/07/2022 Time: SLP Start Time (ACUTE ONLY): 1453 SLP Stop Time (ACUTE ONLY): 1507 SLP Time Calculation (min) (ACUTE ONLY): 14 min  Past Medical History:  Past Medical History:  Diagnosis Date   Asthma    Cerebral thrombosis with cerebral infarction ([place])    Headache(784.0)    Spastic hemiplegia affecting dominant side ([place])    Stroke ([place])    TBI (traumatic brain injury) ([place])    Past Surgical History:  Past Surgical History:  Procedure Laterality Date   ANKLE SURGERY     EYE SURGERY     TONSILECTOMY, ADENOIDECTOMY, BILATERAL MYRINGOTOMY AND TUBES     HPI:  Pt is a 37 y/o male who presented with 1 month of cough, fevers, chills, malaise, and SOB. chest tube placed 2/18. Pt found to have found to have cavitary PNA  and empyema. SLP consulted secondary to reported frequent aspiration events at home and in hospital and multiple dental caries and abscesses in the past. 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    Assessment / Plan / Recommendation  Clinical Impression  Pt was seen for bedside swallow evaluation. He reported that he has been demonstrating occasional coughing, "choking", and solids "getting away from me" (premature spillage suspected) since his CVA ~10 years prior, and he frequently reiterated that this is not a consistent issue. Oral motor strength and ROM appeared grossly WFL; dentition was reduced and in poor condition with multiple dental caries. He tolerated all solids and liquids without signs or symptoms of oropharyngeal dysphagia, but bolus sizes were reduced per pt's request/refusal. Considering pt's reports, a modified barium swallow study is recommended to further assess physiology. Pt was advised of this, but stated that he is disinterested since he does not cough consistently. When  advised of the potential for dysphagia-related adverse events, pt reiterated that he did not want to have instrumental assessment completed and stated, "the less I know the better off I am". Pt's current diet of regular texture solids with thin liquids may be continued. SLP services will be discontinued at this time per pt's request; should pt subsequently desire for have instrumental assessment completed, SLP may be reconsulted. SLP Visit Diagnosis: Dysphagia, unspecified (R13.10)    Aspiration Risk  Mild aspiration risk    Diet Recommendation Regular;Thin liquid   Liquid Administration via: Straw;Cup Medication Administration: Whole meds with liquid Supervision: Patient able to self feed Compensations: Slow rate;Small sips/bites Postural Changes: Seated upright at 90 degrees    Other  Recommendations Oral Care Recommendations: Oral care BID    Recommendations for follow up therapy are one component of a multi-disciplinary discharge planning process, led by the attending physician.  Recommendations may be updated based on patient status, additional functional criteria and insurance authorization.  Follow up Recommendations  (TBD)      Assistance Recommended at Discharge    Functional Status Assessment Patient has had a recent decline in their functional status and demonstrates the ability to make significant improvements in function in a reasonable and predictable amount of time.  Frequency and Duration min 2x/week  2 weeks       Prognosis Prognosis for improved oropharyngeal function: Good Barriers to Reach Goals: Time post onset      Swallow Study   General Date of Onset: 07/03/22 HPI: Pt is a 37 y/o male who presented with 1 month of cough,  fevers, chills, malaise, and SOB. chest tube placed 2/18. Pt found to have found to have cavitary PNA  and empyema. SLP consulted secondary to reported frequent aspiration events at home and in hospital and multiple dental caries and  abscesses in the past. 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 Type of Study: Bedside Swallow Evaluation Previous Swallow Assessment: none Diet Prior to this Study: Regular;Thin liquids (Level 0) Temperature Spikes Noted: No Respiratory Status: Room air History of Recent Intubation: No Behavior/Cognition: Alert;Cooperative;Pleasant mood Oral Cavity Assessment: Within Functional Limits Oral Care Completed by SLP: No Oral Cavity - Dentition: Missing dentition;Poor condition Vision: Functional for self-feeding Self-Feeding Abilities: Able to feed self Patient Positioning: Upright in bed;Postural control adequate for testing Baseline Vocal Quality: Normal Volitional Cough: Strong Volitional Swallow: Able to elicit    Oral/Motor/Sensory Function Overall Oral Motor/Sensory Function: Within functional limits   Ice Chips Ice chips: Within functional limits Presentation: Spoon   Thin Liquid Thin Liquid: Within functional limits Presentation: Straw    Nectar Thick Nectar Thick Liquid: Not tested   Honey Thick Honey Thick Liquid: Not tested   Puree Puree: Within functional limits Presentation: Spoon   Solid     Solid: Within functional limits Presentation: [place] I. Hardin Negus, [place], [place] Office number [phone]  [name] 07/07/2022,3:44 PM

## 2022-07-07 NOTE — Progress Notes (Signed)
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  Went to [place] found to have cavitary PNA + empyema so sent to [facility].  Seen in [place], on RA, in good spirits.  Pertinent  Medical History   Past Medical History:  Diagnosis Date   Asthma    Cerebral thrombosis with cerebral infarction ([place])    Headache(784.0)    Spastic hemiplegia affecting dominant side ([place])    Stroke ([place])    TBI (traumatic brain injury) ([place])     Significant Hospital Events: Including procedures, antibiotic start and stop dates in addition to other pertinent events   2/17 admit 2/18 chest tube placed 2/19 tube lytics given 2/22 no acute events overnight, 150 mL out of chest tube within the last 24 hours  Interim History / Subjective:  Seen sitting up in bedside with reported chest pain at chest tube insertion site and continued productive cough.  When questioned about aspiration patient and family do endorse rather frequent aspiration events in the setting of prior stroke  Objective   Blood pressure (!) 126/100, pulse 90, temperature 98.6 F (37 C), temperature source Oral, resp. rate 17, height 6' 3"$  (1.905 m), weight 97.6 kg, SpO2 98 %.        Intake/Output Summary (Last 24 hours) at 07/07/2022 0732 Last data filed at 07/07/2022 [id number] Gross per 24 hour  Intake 942.49 ml  Output 4475 ml  Net -3532.51 ml    Filed Weights   07/05/22 0239 07/06/22 0500 07/07/22 0500  Weight: 97.6 kg 96.9 kg 97.6 kg    Examination: General: Acute on chronic ill-appearing adult male sitting up in bedside recliner in no acute distress HEENT: [place]/AT, MM pink/moist,  PERRL,  Neuro: Alert and oriented x 3, right-sided paralysis in the setting of prior stroke CV: s1s2 regular rate and rhythm, no murmur, rubs, or gallops,  PULM: Diminished breath sounds left, pigtail chest tube in place, on room air, no increased work of breathing GI: soft, bowel sounds active in all 4 quadrants, non-tender, non-distended, tolerating oral diet Extremities: warm/dry, no edema  Skin: no rashes or lesions  Assessment & Plan:   Necrotic pneumonia with empyema formation  -No risk factors nor is imaging c/w TB. S/p left chest tube placement, tube somewhat migrated. Pleural cultures growing strep intermedius. -He has multiple dental caries with abscesses in the past, coupled with likely ongoing aspiration events in the setting of prior stroke.  Both patient and family reports frequent "small" aspiration events  P: Obtain CT scan chest today As needed oxycodone and lidocaine patch for better pain control SLP consult for concern for ongoing aspiration Continue chest tube until output drops below 134ms/24hrs Routine chest tube care  Continue IV Zosyn  Mobile as able  Daily CXR  Encouraged patient to follow-up with dentistry upon discharge  Rest per primary team.  Signature:   Jennalynn Rivard D. Harris, NP-C [facility] Personal contact information can be found on Amion  If no contact or response made please call [phone] 07/07/2022, 7:32 AM

## 2022-07-07 NOTE — Progress Notes (Signed)
PROGRESS NOTE    [name]  [id number] DOB: 1985-11-06 DOA: 07/02/2022 PCP: [name], MD   No chief complaint on file.   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  He was seen by his local PCP.  Chest x-ray demonstrated pleural effusion.  And a white count of to 29,000.  Patient referred to the local emergency department.  CT scan performed there showed a large loculated pleural effusion with empyema.  He also had a cavitary lesion.  Transfer requested from [facility] to [facility] for CT surgery evaluation.   Patient has numerous dental caries.  He states that he has been coughing up thick sputum.  Denies any hemoptysis.   On arrival to [facility], temp 101.4 heart rate 124 blood pressure 136/83 satting 95% on room air.   His CT scan from [facility] was reviewed.  He has a cavitary left upper lobe lesion.  He also has a loculated pleural effusion.  Appearance is consistent with empyema.  -PCCM were consulted, chest tube were inserted, remains with significant output, cultures growing Streptococcus intermedius, chest tube management per PCCM, antibiotics management per ID.  Assessment & Plan:   Principal Problem:   Empyema ([name])  - left side Active Problems:   Loculated pleural effusion   Sepsis without acute organ dysfunction (HCC)   Pulmonary cavitary lesion - left side   Hemiparesis affecting right side as late effect of cerebrovascular accident (CVA) ([place])   Tobacco abuse   Empyema ([name])  - left side - S/p chest tube placement by pulmonary with significant drainage of pustular discharge-cultures growing strep intermedius-pending susceptibility  - MRSA PCR negative.   - Preliminary culture results from [facility] negative for blood  and urine, sputum cultures with some yeast and GPC -ID consulted -Status post fibrinolytics -History of management per PCCM, continue with chest tube currently till output drops below 100 cc every 24 hours -Need repeat CT chest after discontinuing chest tube to establish new baseline   Pulmonary cavitary lesion - left side Has cavitary lesions seen on CT. Percival Spanish test sent from [place].  Results came back indeterminant, discussed with ID, nothing to do for now.  Thrombocytosis -  platelet count significantly elevated, this is most likely acute phase reactant in the setting of infection, continue with DVT prophylaxis, will add aspirin, will monitor closely   Sepsis without acute organ dysfunction ([name]) Due to cavitary pneumonia and empyema. Has WBC of 26K on admission. Fulfills sepsis criteria. Has fever of 101.4 HR 124.  Leukocytosis improving, procalcitonin at 0.57. -Follow-up culture results -Continue Zosyn for now     Hemiparesis affecting right side as late effect of cerebrovascular accident (CVA) ([name]) Chronic. Pt states he can walk on right LE. 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   Tobacco abuse Chronic. -Nicotine patch as needed        DVT prophylaxis: Lovenox Code Status: Full Family Communication: None at bedside Disposition:   Status is: Inpatient    Consultants:  ID PCCM   Subjective:  No significant events overnight, pain is controlled, he remains afebrile   Objective: Vitals:   07/07/22 0725 07/07/22 0823 07/07/22 1004 07/07/22 1101  BP: (!) 126/100   102/88  Pulse:   96 88  Resp:   16 (!) 21  Temp:    97.7 F (36.5 C)  TempSrc:    Oral  SpO2:  97% 97% 96%  Weight:      Height:        Intake/Output Summary (Last 24 hours) at 07/07/2022 1243 Last data filed at 07/07/2022 1147 Gross per 24 hour  Intake 572.49 ml  Output 3275 ml  Net -2702.51 ml   Filed Weights   07/05/22 0239 07/06/22 0500 07/07/22 0500  Weight: 97.6 kg  96.9 kg 97.6 kg    Examination:   Awake Alert, Oriented X 3, No new F.N deficits, Normal affect, right upper extremity chronically contracted Symmetrical Chest wall movement, Good air movement bilaterally, left-sided chest tube. RRR,No Gallops,Rubs or new Murmurs, No Parasternal Heave +ve B.Sounds, Abd Soft, No tenderness, No rebound - guarding or rigidity. No Cyanosis, Clubbing or edema, No new Rash or bruise    Data Reviewed: I have personally reviewed following labs and imaging studies  CBC: Recent Labs  Lab 07/02/22 2119 07/03/22 0030 07/04/22 0028 07/07/22 0029  WBC 21.2* 20.8* 11.4* 11.8*  NEUTROABS 16.7* 16.0* 7.3  --   HGB 7.9* 7.5* 8.1* 9.6*  HCT 26.1* 24.2* 26.5* 32.0*  MCV 78.4* 77.1* 78.6* 79.0*  PLT 724* 651* 674* 1,055*    Basic Metabolic Panel: Recent Labs  Lab 07/02/22 2119 07/03/22 0030 07/07/22 0029  NA 129* 131* 135  K 3.5 3.6 4.1  CL 99 101 101  CO2 21* 20* 27  GLUCOSE 99 104* 101*  BUN 10 10 <5*  CREATININE 0.98 0.99 0.90  CALCIUM 7.8* 7.9* 8.5*    GFR: Estimated Creatinine Clearance: 136.9 mL/min (by C-G formula based on SCr of 0.9 mg/dL).  Liver Function Tests: Recent Labs  Lab 07/02/22 2119 07/03/22 0030  AST 13* 12*  ALT 12 13  ALKPHOS 119 101  BILITOT 0.2* 0.8  PROT 6.7 6.5  ALBUMIN 1.9* 1.8*    CBG: No results for input(s): "GLUCAP" in the last 168 hours.   Recent Results (from the past 240 hour(s))  MRSA Next Gen by PCR, Nasal     Status: None   Collection Time: 07/02/22  8:10 PM   Specimen: Nasal Mucosa; Nasal Swab  Result Value Ref Range Status   MRSA by PCR Next Gen NOT DETECTED NOT DETECTED Final    Comment: (NOTE) The GeneXpert MRSA Assay (FDA approved for NASAL specimens only), is one component of a comprehensive MRSA colonization surveillance program. It is not intended to diagnose MRSA infection nor to guide or monitor treatment for MRSA infections. Test performance is not FDA approved in patients less  than 43 years old. Performed at [facility], [place] [address] [place], [place] [address]   Body fluid culture w Gram Stain     Status: None (Preliminary result)   Collection Time: 07/03/22  9:05 AM   Specimen: Pleural Fluid  Result Value Ref Range Status   Specimen Description PLEURAL  Final   Special Requests LEFT  Final   Gram Stain   Final    ABUNDANT WBC PRESENT, PREDOMINANTLY PMN FEW GRAM POSITIVE COCCI Gram Stain Report Called to,Read Back By and Verified With:  Margaretmary Dys, RN 07/03/22 2045 A. LAFRANCE    Culture   Final    RARE STREPTOCOCCUS INTERMEDIUS repeating susceptibility Performed at [facility], [place] [address], [place], [place] [address]    Report Status PENDING  Incomplete         Radiology Studies: CT CHEST WO CONTRAST  Result Date: 07/07/2022 CLINICAL DATA:  Empyema EXAM: CT CHEST WITHOUT CONTRAST TECHNIQUE: Multidetector CT imaging of the chest was performed following the standard protocol without IV contrast. RADIATION DOSE REDUCTION: This exam was performed according to the departmental dose-optimization program which includes automated exposure control, adjustment of the mA and/or kV according to patient size and/or use of iterative reconstruction technique. COMPARISON:  07/01/2022 FINDINGS: Cardiovascular: No significant vascular findings. Normal heart size. No pericardial effusion. Mediastinum/Nodes: No pathologically enlarged mediastinal or axillary lymph nodes. Stable left hilar shotty adenopathy, likely reactive in nature. Thyroid gland, trachea, and esophagus demonstrate no significant findings. Lungs/Pleura: Interval left basilar pigtail chest tube placement. Pigtail appears partially unfurled with sideholes extending into the left chest wall. Left pleural fluid has been completely evacuated. Pleural rind and subpleural consolidation persists with small pleural gas likely representing a small pneumothorax ex vacuo. There is left-sided volume  loss with elevation of the left hemidiaphragm and mild mediastinal shift to the left, unchanged. Cavitary mass at the left apex is unchanged measuring 3.4 x 4.8 cm at axial image # 41/4, stable when measured in similar fashion, in keeping with cavitary infection or neoplasm. As noted previously, 7 mm pulmonary nodule within the superior segment of the right lower lobe, axial image # 57/4, is stable since remote prior examination of 05/12/2009 and is safely considered benign. No pneumothorax or pleural effusion on the right. No central obstructing lesion. Upper Abdomen: No acute abnormality. Musculoskeletal: No chest wall mass or suspicious bone lesions identified. IMPRESSION: Interval left chest tube placement with evacuation of left pleural fluid. Persistent pleural thickening and subpleural consolidation with associated left-sided volume loss likely representing at least some degree of trapped lung. Associated small left pneumothorax ex vacuo. Stable 4.8 cm cavitary mass within the left apex in keeping with a a cavitary infection, which is favored given the patient's age, versus cavitary neoplasm. Short-term imaging follow-up is recommended to document stability and/or resolution. Stable right lower lobe pulmonary nodule, safely considered benign. No follow-up imaging is recommended for this lesion. Electronically Signed   By: Fidela Salisbury M.D.   On: 07/07/2022 09:30   DG Chest Port 1 View  Result Date: 07/07/2022 CLINICAL DATA:  Empyema. EXAM: PORTABLE CHEST 1 VIEW COMPARISON:  Chest x-ray July 06, 2022. FINDINGS: No substantial change in loculated left pleural effusion. Pigtail pleural catheter in similar position. Similar overlying opacities. No visible pneumothorax. Right lung is clear. Unchanged cardiomediastinal silhouette. IMPRESSION: No substantial change in loculated left pleural effusion and overlying opacities. Pigtail pleural catheter in similar position. Electronically Signed   By: Margaretha Sheffield M.D.   On: 07/07/2022 08:23   DG CHEST PORT 1 VIEW  Result Date: 07/06/2022 CLINICAL DATA:  Empyema.  Chest drainage catheter in place. EXAM: PORTABLE CHEST 1 VIEW COMPARISON:  Single-view of the chest 07/05/2022. FINDINGS: Pigtail catheter remains in place in the lower left chest. The catheter has backed out slightly. Hazy opacity over the lower left chest consistent with small effusion and airspace disease persist. Small left basilar pneumothorax again seen. The right lung is expanded and clear. Heart size is normal. IMPRESSION: The patient's left chest tube has backed out slightly. Small left hydropneumothorax and basilar airspace disease are unchanged. Electronically Signed   By: Inge Rise M.D.   On: 07/06/2022 08:08        Scheduled Meds:  arformoterol  15 mcg Nebulization BID   baclofen  20 mg Oral TID   budesonide (PULMICORT) nebulizer solution  0.25 mg  Nebulization BID   enoxaparin (LOVENOX) injection  40 mg Subcutaneous Daily   lidocaine  1 patch Transdermal Q24H   sodium chloride flush  10 mL Intrapleural Q8H   Continuous Infusions:  piperacillin-tazobactam (ZOSYN)  IV 12.5 mL/hr at 07/07/22 0530     LOS: 5 days      Phillips Climes, MD [facility]   To contact the attending provider between 7A-7P or the covering provider during after hours 7P-7A, please log into the web site [email or web address] and access using universal [place] password for that web site. If you do not have the password, please call the hospital operator.  07/07/2022, 12:43 PM

## 2022-07-07 NOTE — Plan of Care (Signed)
  Problem: Clinical Measurements: Goal: Ability to maintain clinical measurements within normal limits will improve Outcome: Progressing Goal: Diagnostic test results will improve Outcome: Progressing Goal: Respiratory complications will improve Outcome: Progressing Goal: Cardiovascular complication will be avoided Outcome: Progressing   Problem: Activity: Goal: Risk for activity intolerance will decrease Outcome: Progressing   Problem: Nutrition: Goal: Adequate nutrition will be maintained Outcome: Progressing   Problem: Coping: Goal: Level of anxiety will decrease Outcome: Progressing

## 2022-07-08 ENCOUNTER — Telehealth: Payer: Self-pay | Admitting: Student

## 2022-07-08 ENCOUNTER — Other Ambulatory Visit (HOSPITAL_COMMUNITY): Payer: Self-pay

## 2022-07-08 DIAGNOSIS — J869 Pyothorax without fistula: Secondary | ICD-10-CM | POA: Diagnosis not present

## 2022-07-08 LAB — BASIC METABOLIC PANEL
Anion gap: 10 (ref 5–15)
BUN: 5 mg/dL — ABNORMAL LOW (ref 6–20)
CO2: 25 mmol/L (ref 22–32)
Calcium: 9.3 mg/dL (ref 8.9–10.3)
Chloride: 101 mmol/L (ref 98–111)
Creatinine, Ser: 1.05 mg/dL (ref 0.61–1.24)
GFR, Estimated: >60 mL/min (ref >60)
Glucose, Bld: 108 mg/dL — ABNORMAL HIGH (ref 70–99)
Potassium: 4.4 mmol/L (ref 3.5–5.1)
Sodium: 136 mmol/L (ref 135–145)

## 2022-07-08 LAB — CBC
HCT: 38.5 % — ABNORMAL LOW (ref 39.0–52.0)
Hemoglobin: 11 g/dL — ABNORMAL LOW (ref 13.0–17.0)
MCH: 23.4 pg — ABNORMAL LOW (ref 26.0–34.0)
MCHC: 28.6 g/dL — ABNORMAL LOW (ref 30.0–36.0)
MCV: 81.7 fL (ref 80.0–100.0)
Platelets: 1145 10*3/uL (ref 150–400)
RBC: 4.71 MIL/uL (ref 4.22–5.81)
RDW: 18.6 % — ABNORMAL HIGH (ref 11.5–15.5)
WBC: 11 10*3/uL — ABNORMAL HIGH (ref 4.0–10.5)
nRBC: 0 % (ref 0.0–0.2)

## 2022-07-08 MED ORDER — OXYCODONE HCL 5 MG PO TABS
5.0000 mg | ORAL_TABLET | Freq: Four times a day (QID) | ORAL | 0 refills | Status: AC | PRN
  Filled 2022-07-08: qty 10, 3d supply, fill #0

## 2022-07-08 MED ORDER — SODIUM CHLORIDE 0.9 % IV SOLN
3.0000 g | Freq: Four times a day (QID) | INTRAVENOUS | Status: DC
  Administered 2022-07-08 – 2022-07-09 (×4): 3 g via INTRAVENOUS
  Filled 2022-07-08 (×4): qty 8

## 2022-07-08 MED ORDER — AMOXICILLIN-POT CLAVULANATE 875-125 MG PO TABS
1.0000 | ORAL_TABLET | Freq: Two times a day (BID) | ORAL | 0 refills | Status: DC
  Filled 2022-07-08: qty 42, 21d supply, fill #0

## 2022-07-08 NOTE — Telephone Encounter (Signed)
Can we set up clinic follow up in 4-5 weeks with CXR?  Thanks!

## 2022-07-08 NOTE — Progress Notes (Signed)
Mobility Specialist Progress Note:   07/08/22 1100  Mobility  Activity Refused mobility   Pt refusing, no reason specified. Will follow-up as time allows.   Gareth Eagle Brinley Rosete Mobility Specialist Please contact via [employer] or  Rehab Office at [phone]

## 2022-07-08 NOTE — Progress Notes (Signed)
PROGRESS NOTE    [name]  [id number] DOB: Jan 02, 1986 DOA: 07/02/2022 PCP: [name], MD   No chief complaint on file.   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  He was seen by his local PCP.  Chest x-ray demonstrated pleural effusion.  And a white count of to 29,000.  Patient referred to the local emergency department.  CT scan performed there showed a large loculated pleural effusion with empyema.  He also had a cavitary lesion.  Transfer requested from [facility] to [facility] for CT surgery evaluation.   Patient has numerous dental caries.  He states that he has been coughing up thick sputum.  Denies any hemoptysis.   On arrival to [facility], temp 101.4 heart rate 124 blood pressure 136/83 satting 95% on room air.   His CT scan from [facility] was reviewed.  He has a cavitary left upper lobe lesion.  He also has a loculated pleural effusion.  Appearance is consistent with empyema.  -PCCM were consulted, chest tube were inserted, remains with significant output, cultures growing Streptococcus intermedius, chest tube management per PCCM, antibiotics management per ID.  Assessment & Plan:   Principal Problem:   Empyema ([place])  - left side Active Problems:   Loculated pleural effusion   Sepsis without acute organ dysfunction (HCC)   Pulmonary cavitary lesion - left side   Hemiparesis affecting right side as late effect of cerebrovascular accident (CVA) ([place])   Tobacco abuse   Empyema ([place])  - left side - S/p chest tube placement by pulmonary with significant drainage of pustular discharge-cultures growing strep intermedius-pending susceptibility  - MRSA PCR negative.   -Pleural cultures growing Streptococcus intermedius -Status post  fibrinolytics -Chest tube management per PCCM, likely to DC today -Repeat CT chest today showing improving empyema, but trapped lung with positive vacuo pneumothorax and cavitary lesion   Pulmonary cavitary lesion - left side Has cavitary lesions seen on CT. Percival Spanish test sent from [place].  results came back indeterminant, discussed with ID, nothing to do for now.  Thrombocytosis -  platelet count significantly elevated, this is most likely acute phase reactant in the setting of infection, continue with DVT prophylaxis, will add aspirin, will monitor closely   Sepsis without acute organ dysfunction ([place]) Due to cavitary pneumonia and empyema. Has WBC of 26K on admission. Fulfills sepsis criteria. Has fever of 101.4 HR 124.  Leukocytosis improving, procalcitonin at 0.57. -Follow-up culture results -Continue Zosyn for now, he can be discharged on Augmentin.     Hemiparesis affecting right side as late effect of cerebrovascular accident (CVA) ([place]) Chronic. Pt states he can walk on right LE. 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   Tobacco abuse Chronic. -Nicotine patch as needed        DVT prophylaxis: Lovenox Code Status: Full Family Communication: None at bedside Disposition:   Status is: Inpatient    Consultants:  ID PCCM   Subjective:  No significant events overnight, he denies any complaints today, he ambulated with PT yesterday   Objective: Vitals:   07/08/22 0758 07/08/22 0800 07/08/22 0822 07/08/22 0849  BP: 111/77     Pulse: 75 79    Resp: 16 (!) 22 14   Temp: 97.6 F (36.4 C)     TempSrc:  Oral     SpO2: 98% 98%  96%  Weight:      Height:        Intake/Output Summary (Last 24 hours) at 07/08/2022 1012 Last data filed at 07/08/2022 0855 Gross per 24 hour  Intake 842.08 ml  Output 2160 ml  Net -1317.92 ml   Filed Weights   07/06/22 0500 07/07/22 0500 07/08/22 0309  Weight: 96.9 kg 97.6 kg 95 kg    Examination:   Awake Alert,  Oriented X 3, No new F.N deficits, Normal affect, right upper extremity chronically contracted Symmetrical Chest wall movement, Good air movement bilaterally, left-sided chest tube. RRR,No Gallops,Rubs or new Murmurs, No Parasternal Heave +ve B.Sounds, Abd Soft, No tenderness, No rebound - guarding or rigidity. No Cyanosis, Clubbing or edema, No new Rash or bruise    Data Reviewed: I have personally reviewed following labs and imaging studies  CBC: Recent Labs  Lab 07/02/22 2119 07/03/22 0030 07/04/22 0028 07/07/22 0029 07/08/22 0035  WBC 21.2* 20.8* 11.4* 11.8* 11.0*  NEUTROABS 16.7* 16.0* 7.3  --   --   HGB 7.9* 7.5* 8.1* 9.6* 11.0*  HCT 26.1* 24.2* 26.5* 32.0* 38.5*  MCV 78.4* 77.1* 78.6* 79.0* 81.7  PLT 724* 651* 674* 1,055* 1,145*    Basic Metabolic Panel: Recent Labs  Lab 07/02/22 2119 07/03/22 0030 07/07/22 0029 07/08/22 0035  NA 129* 131* 135 136  K 3.5 3.6 4.1 4.4  CL 99 101 101 101  CO2 21* 20* 27 25  GLUCOSE 99 104* 101* 108*  BUN 10 10 <5* 5*  CREATININE 0.98 0.99 0.90 1.05  CALCIUM 7.8* 7.9* 8.5* 9.3    GFR: Estimated Creatinine Clearance: 117.4 mL/min (by C-G formula based on SCr of 1.05 mg/dL).  Liver Function Tests: Recent Labs  Lab 07/02/22 2119 07/03/22 0030  AST 13* 12*  ALT 12 13  ALKPHOS 119 101  BILITOT 0.2* 0.8  PROT 6.7 6.5  ALBUMIN 1.9* 1.8*    CBG: No results for input(s): "GLUCAP" in the last 168 hours.   Recent Results (from the past 240 hour(s))  MRSA Next Gen by PCR, Nasal     Status: None   Collection Time: 07/02/22  8:10 PM   Specimen: Nasal Mucosa; Nasal Swab  Result Value Ref Range Status   MRSA by PCR Next Gen NOT DETECTED NOT DETECTED Final    Comment: (NOTE) The GeneXpert MRSA Assay (FDA approved for NASAL specimens only), is one component of a comprehensive MRSA colonization surveillance program. It is not intended to diagnose MRSA infection nor to guide or monitor treatment for MRSA infections. Test  performance is not FDA approved in patients less than 79 years old. Performed at [facility], [place] [address], [place], [place] [address]   Body fluid culture w Gram Stain     Status: None (Preliminary result)   Collection Time: 07/03/22  9:05 AM   Specimen: Pleural Fluid  Result Value Ref Range Status   Specimen Description PLEURAL  Final   Special Requests LEFT  Final   Gram Stain   Final    ABUNDANT WBC PRESENT, PREDOMINANTLY PMN FEW GRAM POSITIVE COCCI Gram Stain Report Called to,Read Back By and Verified With:  Margaretmary Dys, RN 07/03/22 2045 A. LAFRANCE    Culture   Final    RARE STREPTOCOCCUS INTERMEDIUS repeating susceptibility Performed at [facility], [place] [address] [place], [place] [address]    Report Status PENDING  Incomplete  Radiology Studies: CT CHEST WO CONTRAST  Result Date: 07/07/2022 CLINICAL DATA:  [place] EXAM: CT CHEST WITHOUT CONTRAST TECHNIQUE: Multidetector CT imaging of the chest was performed following the standard protocol without IV contrast. RADIATION DOSE REDUCTION: This exam was performed according to the departmental dose-optimization program which includes automated exposure control, adjustment of the mA and/or kV according to patient size and/or use of iterative reconstruction technique. COMPARISON:  07/01/2022 FINDINGS: Cardiovascular: No significant vascular findings. Normal heart size. No pericardial effusion. Mediastinum/Nodes: No pathologically enlarged mediastinal or axillary lymph nodes. Stable left hilar shotty adenopathy, likely reactive in nature. Thyroid gland, trachea, and esophagus demonstrate no significant findings. Lungs/Pleura: Interval left basilar pigtail chest tube placement. Pigtail appears partially unfurled with sideholes extending into the left chest wall. Left pleural fluid has been completely evacuated. Pleural rind and subpleural consolidation persists with small pleural gas likely representing a small  pneumothorax ex vacuo. There is left-sided volume loss with elevation of the left hemidiaphragm and mild mediastinal shift to the left, unchanged. Cavitary mass at the left apex is unchanged measuring 3.4 x 4.8 cm at axial image # 41/4, stable when measured in similar fashion, in keeping with cavitary infection or neoplasm. As noted previously, 7 mm pulmonary nodule within the superior segment of the right lower lobe, axial image # 57/4, is stable since remote prior examination of 05/12/2009 and is safely considered benign. No pneumothorax or pleural effusion on the right. No central obstructing lesion. Upper Abdomen: No acute abnormality. Musculoskeletal: No chest wall mass or suspicious bone lesions identified. IMPRESSION: Interval left chest tube placement with evacuation of left pleural fluid. Persistent pleural thickening and subpleural consolidation with associated left-sided volume loss likely representing at least some degree of trapped lung. Associated small left pneumothorax ex vacuo. Stable 4.8 cm cavitary mass within the left apex in keeping with a a cavitary infection, which is favored given the patient's age, versus cavitary neoplasm. Short-term imaging follow-up is recommended to document stability and/or resolution. Stable right lower lobe pulmonary nodule, safely considered benign. No follow-up imaging is recommended for this lesion. Electronically Signed   By: Fidela Salisbury M.D.   On: 07/07/2022 09:30   DG Chest Port 1 View  Result Date: 07/07/2022 CLINICAL DATA:  Empyema. EXAM: PORTABLE CHEST 1 VIEW COMPARISON:  Chest x-ray July 06, 2022. FINDINGS: No substantial change in loculated left pleural effusion. Pigtail pleural catheter in similar position. Similar overlying opacities. No visible pneumothorax. Right lung is clear. Unchanged cardiomediastinal silhouette. IMPRESSION: No substantial change in loculated left pleural effusion and overlying opacities. Pigtail pleural catheter in  similar position. Electronically Signed   By: Margaretha Sheffield M.D.   On: 07/07/2022 08:23        Scheduled Meds:  arformoterol  15 mcg Nebulization BID   aspirin EC  81 mg Oral Daily   baclofen  20 mg Oral TID   budesonide (PULMICORT) nebulizer solution  0.25 mg Nebulization BID   enoxaparin (LOVENOX) injection  40 mg Subcutaneous Daily   hydrocortisone cream   Topical BID   lidocaine  1 patch Transdermal Q24H   sodium chloride flush  10 mL Intrapleural Q8H   Continuous Infusions:  piperacillin-tazobactam (ZOSYN)  IV 12.5 mL/hr at 07/08/22 [id number]     LOS: 6 days      Phillips Climes, MD [facility]   To contact the attending provider between 7A-7P or the covering provider during after hours 7P-7A, please log into the web site [email or web address] and access using universal [place]  password for that web site. If you do not have the password, please call the hospital operator.  07/08/2022, 10:12 AM

## 2022-07-08 NOTE — TOC Progression Note (Signed)
Discharge medications (2) are being stored in the main pharmacy on the ground floor until patient is ready for discharge.   

## 2022-07-08 NOTE — Progress Notes (Signed)
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  Went to [facility] found to have cavitary PNA + empyema so sent to [facility].  Seen in [place], on RA, in good spirits.  Pertinent  Medical History   Past Medical History:  Diagnosis Date   Asthma    Cerebral thrombosis with cerebral infarction ([place])    Headache(784.0)    Spastic hemiplegia affecting dominant side ([place])    Stroke ([place])    TBI (traumatic brain injury) ([place])     Significant Hospital Events: Including procedures, antibiotic start and stop dates in addition to other pertinent events   2/17 admit 2/18 chest tube placed 2/19 tube lytics given 2/22 no acute events overnight, 150 mL out of chest tube within the last 24 hours  Interim History / Subjective:  CT with dramatic improvement., ~200cc output in last 24h  Objective   Blood pressure 131/74, pulse 91, temperature 97.6 F (36.4 C), temperature source Oral, resp. rate 13, height '6\' 3"'$  (1.905 m), weight 95 kg, SpO2 93 %.        Intake/Output Summary (Last 24 hours) at 07/08/2022 1120 Last data filed at 07/08/2022 1102 Gross per 24 hour  Intake 870.69 ml  Output 2160 ml  Net -1289.31 ml   Filed Weights   07/06/22 0500 07/07/22 0500 07/08/22 0309  Weight: 96.9 kg 97.6 kg 95 kg    Examination: General appearance: 37 y.o., male, NAD Eyes: PERRL, tracking appropriately HENT: NCAT; MMM Neck: Trachea midline; no lymphadenopathy, no JVD Lungs: diminished on left, with normal respiratory effort CV: RRR, no murmur  Abdomen: Soft, non-tender; non-distended, BS present  Extremities: No peripheral edema, warm Skin: Normal  turgor and texture; no rash Neuro: attentive   Assessment & Plan:   Necrotic pneumonia with empyema formation  -No risk factors nor is imaging c/w TB. S/p left chest tube placement, tube somewhat migrated. Pleural cultures growing strep intermedius. -He has multiple dental caries with abscesses in the past, coupled with likely ongoing aspiration events in the setting of prior stroke.  Both patient and family reports frequent "small" aspiration events  P: Chest tube removed OK to discharge from our standpoint with augmentin to complete 3 week total course of antibiotics  We'll check CXR in clinic in 4-5 weeks Encouraged patient to follow-up with dentistry upon discharge  Rest per primary team.  Signature:   [facility]  07/08/2022, 11:20 AM

## 2022-07-08 NOTE — Progress Notes (Signed)
    [facility]   Reason for visit: Follow up on empyema  Interval History: chest tube removed; WBC 11.  Remains afebrile.    Physical Exam: Constitutional:  Vitals:   07/08/22 1050 07/08/22 1100  BP: 131/74   Pulse: 97 91  Resp: 12 13  Temp: 97.6 F (36.4 C)   SpO2: 97% 93%   patient appears in NAD Respiratory: Normal respiratory effort  Review of Systems: Constitutional: negative for fevers and chills  Lab Results  Component Value Date   WBC 11.0 (H) 07/08/2022   HGB 11.0 (L) 07/08/2022   HCT 38.5 (L) 07/08/2022   MCV 81.7 07/08/2022   PLT 1,145 (HH) 07/08/2022    Lab Results  Component Value Date   CREATININE 1.05 07/08/2022   BUN 5 (L) 07/08/2022   NA 136 07/08/2022   K 4.4 07/08/2022   CL 101 07/08/2022   CO2 25 07/08/2022    Lab Results  Component Value Date   ALT 13 07/03/2022   AST 12 (L) 07/03/2022   ALKPHOS 101 07/03/2022     Microbiology: Recent Results (from the past 240 hour(s))  MRSA Next Gen by PCR, Nasal     Status: None   Collection Time: 07/02/22  8:10 PM   Specimen: Nasal Mucosa; Nasal Swab  Result Value Ref Range Status   MRSA by PCR Next Gen NOT DETECTED NOT DETECTED Final    Comment: (NOTE) The GeneXpert MRSA Assay (FDA approved for NASAL specimens only), is one component of a comprehensive MRSA colonization surveillance program. It is not intended to diagnose MRSA infection nor to guide or monitor treatment for MRSA infections. Test performance is not FDA approved in patients less than 72 years old. Performed at [facility], [place] [address] [place], [place] [id number]   Body fluid culture w Gram Stain     Status: None (Preliminary result)   Collection Time: 07/03/22  9:05 AM   Specimen: Pleural Fluid  Result Value Ref Range Status   Specimen Description PLEURAL  Final   Special Requests LEFT  Final   Gram Stain   Final    ABUNDANT WBC PRESENT, PREDOMINANTLY PMN FEW GRAM POSITIVE COCCI Gram  Stain Report Called to,Read Back By and Verified With:  Margaretmary Dys, RN 07/03/22 2045 A. [place]    Culture   Final    RARE STREPTOCOCCUS INTERMEDIUS SENT TO [facility] FOR SUSCEPTIBILITY TESTING. Performed at [facility], [place] [address] [place], [place] [address]    Report Status PENDING  Incomplete    Impression/Plan:  1. Empyema - Strep intermedius in culture and likely mixed anaerobes.  Has improved on pip/tazo.  Chest tube now out after the CT findings.   I agree with recommendations by Dr. Verlee Monte with Augmentin for 3 weeks and CXR follow up.    2.  Poor dentition - will need outpatient dental evaluation  3.  Medictaion monitoring - creat has remained stable.    Agree with discharge from ID standpoint.  Has follow up with pulmonary so does not need ID follow up unless otherwise requested.

## 2022-07-08 NOTE — Plan of Care (Signed)
  Problem: Health Behavior/Discharge Planning: Goal: Ability to manage health-related needs will improve Outcome: Progressing   Problem: Clinical Measurements: Goal: Ability to maintain clinical measurements within normal limits will improve Outcome: Progressing Goal: Will remain free from infection Outcome: Progressing Goal: Diagnostic test results will improve Outcome: Progressing Goal: Respiratory complications will improve Outcome: Progressing Goal: Cardiovascular complication will be avoided Outcome: Progressing   Problem: Activity: Goal: Risk for activity intolerance will decrease Outcome: Progressing   Problem: Nutrition: Goal: Adequate nutrition will be maintained Outcome: Progressing   Problem: Coping: Goal: Level of anxiety will decrease Outcome: Progressing   Problem: Elimination: Goal: Will not experience complications related to bowel motility Outcome: Progressing Goal: Will not experience complications related to urinary retention Outcome: Progressing   Problem: Pain Managment: Goal: General experience of comfort will improve Outcome: Progressing   Problem: Safety: Goal: Ability to remain free from injury will improve Outcome: Progressing   Problem: Skin Integrity: Goal: Risk for impaired skin integrity will decrease Outcome: Progressing   Problem: Activity: Goal: Ability to tolerate increased activity will improve Outcome: Progressing   Problem: Clinical Measurements: Goal: Ability to maintain a body temperature in the normal range will improve Outcome: Progressing   Problem: Respiratory: Goal: Ability to maintain adequate ventilation will improve Outcome: Progressing Goal: Ability to maintain a clear airway will improve Outcome: Progressing   

## 2022-07-08 NOTE — Telephone Encounter (Signed)
Scheduled HFU with Dr Verlee Monte on 3/14. Nothing further needed.

## 2022-07-09 LAB — CBC
HCT: 36.2 % — ABNORMAL LOW (ref 39.0–52.0)
Hemoglobin: 10.9 g/dL — ABNORMAL LOW (ref 13.0–17.0)
MCH: 23.7 pg — ABNORMAL LOW (ref 26.0–34.0)
MCHC: 30.1 g/dL (ref 30.0–36.0)
MCV: 78.9 fL — ABNORMAL LOW (ref 80.0–100.0)
Platelets: 1220 10*3/uL (ref 150–400)
RBC: 4.59 MIL/uL (ref 4.22–5.81)
RDW: 18.9 % — ABNORMAL HIGH (ref 11.5–15.5)
WBC: 10.6 10*3/uL — ABNORMAL HIGH (ref 4.0–10.5)
nRBC: 0 % (ref 0.0–0.2)

## 2022-07-09 MED ORDER — AMOXICILLIN-POT CLAVULANATE 875-125 MG PO TABS: 1.0000 | ORAL_TABLET | Freq: Two times a day (BID) | ORAL | Status: DC

## 2022-07-09 MED ORDER — METRONIDAZOLE 500 MG PO TABS: 500.0000 mg | ORAL_TABLET | Freq: Two times a day (BID) | ORAL | 0 refills | Status: AC

## 2022-07-09 MED ORDER — CEFADROXIL 500 MG PO CAPS: 500.0000 mg | ORAL_CAPSULE | Freq: Two times a day (BID) | ORAL | 0 refills | Status: AC

## 2022-07-09 NOTE — Progress Notes (Signed)
Patient alert and oriented, verbalized understanding of dc instructions. MotherBrenda also present at the bedside.all belongings and toc med given to patient.

## 2022-07-09 NOTE — Discharge Instructions (Signed)
Follow with Primary MD [name], MD in 7 days   Get CBC, CMP, checked  by Primary MD next visit.    Activity: As tolerated with Full fall precautions use walker/cane & assistance as needed   Disposition Home    Diet: Heart Healthy   On your next visit with your primary care physician please Get Medicines reviewed and adjusted.   Please request your Prim.MD to go over all Hospital Tests and Procedure/Radiological results at the follow up, please get all Hospital records sent to your Prim MD by signing hospital release before you go home.   If you experience worsening of your admission symptoms, develop shortness of breath, life threatening emergency, suicidal or homicidal thoughts you must seek medical attention immediately by calling 911 or calling your MD immediately  if symptoms less severe.  You Must read complete instructions/literature along with all the possible adverse reactions/side effects for all the Medicines you take and that have been prescribed to you. Take any new Medicines after you have completely understood and accpet all the possible adverse reactions/side effects.   Do not drive, operating heavy machinery, perform activities at heights, swimming or participation in water activities or provide baby sitting services if your were admitted for syncope or siezures until you have seen by Primary MD or a Neurologist and advised to do so again.  Do not drive when taking Pain medications.    Do not take more than prescribed Pain, Sleep and Anxiety Medications  Special Instructions: If you have smoked or chewed Tobacco  in the last 2 yrs please stop smoking, stop any regular Alcohol  and or any Recreational drug use.  Wear Seat belts while driving.   Please note  You were cared for by a hospitalist during your hospital stay. If you have any questions about your discharge medications or the care you received while you were in the hospital after you are discharged,  you can call the unit and asked to speak with the hospitalist on call if the hospitalist that took care of you is not available. Once you are discharged, your primary care physician will handle any further medical issues. Please note that NO REFILLS for any discharge medications will be authorized once you are discharged, as it is imperative that you return to your primary care physician (or establish a relationship with a primary care physician if you do not have one) for your aftercare needs so that they can reassess your need for medications and monitor your lab values.

## 2022-07-09 NOTE — Discharge Summary (Signed)
Physician Discharge Summary  [name] [id number] DOB: 06-24-1985 DOA: 07/02/2022  PCP: Mike Doyne., MD  Admit date: 07/02/2022 Discharge date: 07/09/2022  Admitted From: (Home) Disposition:  (Home)  Recommendations for Outpatient Follow-up:  Follow up with PCP in 1-2 weeks Please repeat CBC in 1 to 2 weeks to ensure platelet count is trending down, and if it is not then consider hematology referral Patient to follow with pulmonary as an outpatient regarding further management and treatment and repeat imaging for his empyema, PCCM will schedule follow-up appointment    Discharge Condition: (Stable) CODE STATUS: (FULL) Diet recommendation: Heart Healthy  Brief/Interim Summary:  37 year old male 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  He was seen by his local PCP.  Chest x-ray demonstrated pleural effusion.  And a white count of to 29,000.  Patient referred to the local emergency department.  CT scan performed there showed a large loculated pleural effusion with empyema.  He also had a cavitary lesion.  Transfer requested from [facility] to [facility] for CT surgery evaluation.   Patient has numerous dental caries.  He states that he has been coughing up thick sputum.  Denies any hemoptysis.   On arrival to [facility], temp 101.4 heart rate 124 blood pressure 136/83 satting 95% on room air.   His CT [facility] was reviewed.  He has a cavitary left upper lobe lesion.  He also has a loculated pleural effusion.  Appearance is consistent with empyema.  -PCCM were consulted, chest tube were inserted, remains with significant output, cultures growing Streptococcus intermedius, chest tube management per PCCM, antibiotics management per ID.  He did require thrombolytics,  improvement output, chest tube discontinued 2/23, recommendation for outpatient follow-up with PCCM, please see discussion below.  Empyema ([place])  - left side - S/p chest tube placement by pulmonary with significant drainage of pustular discharge-cultures growing strep intermedius, he was treated with IV Zosyn during hospital stay, he will be discharged on 3 weeks of oral Augmentin.  - MRSA PCR negative.   -Status post fibrinolytics -Chest tube management per PCCM, continued 2/23 as Repeat CT chest today showing improving empyema, but trapped lung with positive vacuo pneumothorax and cavitary lesion. -PCCM will follow-up as an outpatient, their well check recheck chest x-ray in 4 to 5 weeks -Patient was encouraged to follow-up with dentistry as an outpatient   Pulmonary cavitary lesion - left side Has cavitary lesions seen on CT. Percival Spanish test sent from [place].  results came back indeterminant, discussed with ID, nothing to do for now.   Thrombocytosis -  platelet count significantly elevated, this is most likely acute phase reactant in the setting of infection, continue with full dose aspirin as an outpatient, recheck CBC as an outpatient.   Sepsis without acute organ dysfunction (HCC) Due to above, this has resolved at time of discharge   Hemiparesis affecting right side as late effect of cerebrovascular accident (CVA) ([place]) Chronic. Pt states he can walk on right LE. 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   Tobacco abuse Chronic. -Nicotine patch as needed    Discharge Diagnoses:  Principal Problem:   Empyema ([place])  - left side Active Problems:   Loculated pleural effusion   Sepsis without acute organ dysfunction (HCC)   Pulmonary cavitary lesion - left side  Hemiparesis affecting right side as late effect of cerebrovascular accident (CVA) ([place])   Tobacco abuse    Discharge Instructions  Discharge Instructions     Diet - low sodium heart healthy   Complete  by: As directed    Discharge instructions   Complete by: As directed    Follow with Primary MD Mike Doyne., MD in 7 days   Get CBC, CMP, checked  by Primary MD next visit.    Activity: As tolerated with Full fall precautions use walker/cane & assistance as needed   Disposition Home    Diet: Heart Healthy   On your next visit with your primary care physician please Get Medicines reviewed and adjusted.   Please request your Prim.MD to go over all Hospital Tests and Procedure/Radiological results at the follow up, please get all Hospital records sent to your Prim MD by signing hospital release before you go home.   If you experience worsening of your admission symptoms, develop shortness of breath, life threatening emergency, suicidal or homicidal thoughts you must seek medical attention immediately by calling 911 or calling your MD immediately  if symptoms less severe.  You Must read complete instructions/literature along with all the possible adverse reactions/side effects for all the Medicines you take and that have been prescribed to you. Take any new Medicines after you have completely understood and accpet all the possible adverse reactions/side effects.   Do not drive, operating heavy machinery, perform activities at heights, swimming or participation in water activities or provide baby sitting services if your were admitted for syncope or siezures until you have seen by Primary MD or a Neurologist and advised to do so again.  Do not drive when taking Pain medications.    Do not take more than prescribed Pain, Sleep and Anxiety Medications  Special Instructions: If you have smoked or chewed Tobacco  in the last 2 yrs please stop smoking, stop any regular Alcohol  and or any Recreational drug use.  Wear Seat belts while driving.   Please note  You were cared for by a hospitalist during your hospital stay. If you have any questions about your discharge medications or the  care you received while you were in the hospital after you are discharged, you can call the unit and asked to speak with the hospitalist on call if the hospitalist that took care of you is not available. Once you are discharged, your primary care physician will handle any further medical issues. Please note that NO REFILLS for any discharge medications will be authorized once you are discharged, as it is imperative that you return to your primary care physician (or establish a relationship with a primary care physician if you do not have one) for your aftercare needs so that they can reassess your need for medications and monitor your lab values.   Increase activity slowly   Complete by: As directed       Allergies as of 07/09/2022   No Active Allergies      Medication List     STOP taking these medications    citalopram 40 MG tablet Commonly known as: CELEXA   clarithromycin 500 MG tablet Commonly known as: BIAXIN   cloNIDine 0.1 MG tablet Commonly known as: CATAPRES   nortriptyline 50 MG capsule Commonly known as: PAMELOR   predniSONE 10 MG tablet Commonly known as: DELTASONE   topiramate 100 MG tablet Commonly known as: Topamax       TAKE these medications  albuterol (2.5 MG/3ML) 0.083% nebulizer solution Commonly known as: PROVENTIL Take 2.5 mg by nebulization every 4 (four) hours as needed for shortness of breath.   albuterol 108 (90 Base) MCG/ACT inhaler Commonly known as: VENTOLIN HFA Inhale 2 puffs into the lungs every 4 (four) hours as needed for shortness of breath.   amitriptyline 50 MG tablet Commonly known as: ELAVIL Take 50 mg by mouth at bedtime.   amoxicillin-clavulanate 875-125 MG tablet Commonly known as: AUGMENTIN Take 1 tablet by mouth 2 (two) times daily.   aspirin 325 MG tablet Take 325 mg by mouth daily.   baclofen 20 MG tablet Commonly known as: LIORESAL TAKE 1 TABLET FOUR TIMES DAILY. What changed: See the new instructions.    DULoxetine 60 MG capsule Commonly known as: CYMBALTA Take 60 mg by mouth 2 (two) times daily.   ergocalciferol 1.25 MG (50000 UT) capsule Commonly known as: VITAMIN D2 Take 50,000 Units by mouth once a week.   fluticasone 110 MCG/ACT inhaler Commonly known as: FLOVENT HFA Inhale 2 puffs into the lungs 2 (two) times daily.   gabapentin 800 MG tablet Commonly known as: NEURONTIN Take 800 mg by mouth 3 (three) times daily. What changed: Another medication with the same name was removed. Continue taking this medication, and follow the directions you see here.   levocetirizine 5 MG tablet Commonly known as: XYZAL Take 5 mg by mouth daily.   Linzess 290 MCG Caps capsule Generic drug: linaclotide Take 290 mcg by mouth daily.   omeprazole 20 MG capsule Commonly known as: PRILOSEC Take 20 mg by mouth daily as needed (For heartburn).   oxyCODONE 5 MG immediate release tablet Commonly known as: Oxy IR/ROXICODONE Take 1 tablet (5 mg total) by mouth every 6 (six) hours as needed for severe pain.   pravastatin 40 MG tablet Commonly known as: PRAVACHOL TAKE 1 TABLET AT BEDTIME. What changed: when to take this   tamsulosin 0.4 MG Caps capsule Commonly known as: Flomax Take 1 capsule (0.4 mg total) by mouth daily after supper.        No Active Allergies  Consultations: PCCM Infectious disease   Procedures/Studies: CT CHEST WO CONTRAST  Result Date: 07/07/2022 CLINICAL DATA:  Empyema EXAM: CT CHEST WITHOUT CONTRAST TECHNIQUE: Multidetector CT imaging of the chest was performed following the standard protocol without IV contrast. RADIATION DOSE REDUCTION: This exam was performed according to the departmental dose-optimization program which includes automated exposure control, adjustment of the mA and/or kV according to patient size and/or use of iterative reconstruction technique. COMPARISON:  07/01/2022 FINDINGS: Cardiovascular: No significant vascular findings. Normal heart  size. No pericardial effusion. Mediastinum/Nodes: No pathologically enlarged mediastinal or axillary lymph nodes. Stable left hilar shotty adenopathy, likely reactive in nature. Thyroid gland, trachea, and esophagus demonstrate no significant findings. Lungs/Pleura: Interval left basilar pigtail chest tube placement. Pigtail appears partially unfurled with sideholes extending into the left chest wall. Left pleural fluid has been completely evacuated. Pleural rind and subpleural consolidation persists with small pleural gas likely representing a small pneumothorax ex vacuo. There is left-sided volume loss with elevation of the left hemidiaphragm and mild mediastinal shift to the left, unchanged. Cavitary mass at the left apex is unchanged measuring 3.4 x 4.8 cm at axial image # 41/4, stable when measured in similar fashion, in keeping with cavitary infection or neoplasm. As noted previously, 7 mm pulmonary nodule within the superior segment of the right lower lobe, axial image # 57/4, is stable since remote prior examination of  05/12/2009 and is safely considered benign. No pneumothorax or pleural effusion on the right. No central obstructing lesion. Upper Abdomen: No acute abnormality. Musculoskeletal: No chest wall mass or suspicious bone lesions identified. IMPRESSION: Interval left chest tube placement with evacuation of left pleural fluid. Persistent pleural thickening and subpleural consolidation with associated left-sided volume loss likely representing at least some degree of trapped lung. Associated small left pneumothorax ex vacuo. Stable 4.8 cm cavitary mass within the left apex in keeping with a a cavitary infection, which is favored given the patient's age, versus cavitary neoplasm. Short-term imaging follow-up is recommended to document stability and/or resolution. Stable right lower lobe pulmonary nodule, safely considered benign. No follow-up imaging is recommended for this lesion. Electronically  Signed   By: Fidela Salisbury M.D.   On: 07/07/2022 09:30   DG Chest Port 1 View  Result Date: 07/07/2022 CLINICAL DATA:  Empyema. EXAM: PORTABLE CHEST 1 VIEW COMPARISON:  Chest x-ray July 06, 2022. FINDINGS: No substantial change in loculated left pleural effusion. Pigtail pleural catheter in similar position. Similar overlying opacities. No visible pneumothorax. Right lung is clear. Unchanged cardiomediastinal silhouette. IMPRESSION: No substantial change in loculated left pleural effusion and overlying opacities. Pigtail pleural catheter in similar position. Electronically Signed   By: Margaretha Sheffield M.D.   On: 07/07/2022 08:23   DG CHEST PORT 1 VIEW  Result Date: 07/06/2022 CLINICAL DATA:  Empyema.  Chest drainage catheter in place. EXAM: PORTABLE CHEST 1 VIEW COMPARISON:  Single-view of the chest 07/05/2022. FINDINGS: Pigtail catheter remains in place in the lower left chest. The catheter has backed out slightly. Hazy opacity over the lower left chest consistent with small effusion and airspace disease persist. Small left basilar pneumothorax again seen. The right lung is expanded and clear. Heart size is normal. IMPRESSION: The patient's left chest tube has backed out slightly. Small left hydropneumothorax and basilar airspace disease are unchanged. Electronically Signed   By: Inge Rise M.D.   On: 07/06/2022 08:08   DG Chest Port 1 View  Result Date: 07/05/2022 CLINICAL DATA:  History of ETT EXAM: PORTABLE CHEST 1 VIEW COMPARISON:  Chest x-ray July 04, 2022. FINDINGS: Similar left pleural effusion with overlying left basilar opacities. Left pleural pigtail catheter in place. Similar small air component, compatible with hydropneumothorax. Cardiomediastinal silhouette is unchanged. Right lung is clear. No acute osseous abnormality. Small amount of subcutaneous emphysema in the left chest wall. IMPRESSION: Unchanged left hydropneumothorax with overlying left basilar opacities.  Electronically Signed   By: Margaretha Sheffield M.D.   On: 07/05/2022 08:28   ECHOCARDIOGRAM COMPLETE  Result Date: 07/04/2022    ECHOCARDIOGRAM REPORT   Patient Name:   [name] Date of Exam: 07/04/2022 Medical Rec #:  [id number]     Height:       75.0 in Accession #:    [id number]    Weight:       212.7 lb Date of Birth:  09/11/1985     BSA:          2.253 m Patient Age:    37 years      BP:           126/82 mmHg Patient Gender: M             HR:           86 bpm. Exam Location:  Inpatient Procedure: 2D Echo, Color Doppler and Cardiac Doppler Indications:    Bacteremia  History:  Patient has no prior history of Echocardiogram examinations.  Sonographer:    Raquel Sarna Senior RDCS Referring Phys: [id number] [place] AMIN  Sonographer Comments: Unable to reposition due to chest tube. IMPRESSIONS  1. Left ventricular ejection fraction, by estimation, is 55 to 60%. The left ventricle has normal function. The left ventricle has no regional wall motion abnormalities. Left ventricular diastolic parameters were normal.  2. Right ventricular systolic function is normal. The right ventricular size is normal.  3. The mitral valve is normal in structure. No evidence of mitral valve regurgitation. No evidence of mitral stenosis.  4. The aortic valve is normal in structure. Aortic valve regurgitation is not visualized. No aortic stenosis is present.  5. The inferior vena cava is normal in size with greater than 50% respiratory variability, suggesting right atrial pressure of 3 mmHg.  6. Technically limited study due to poor sound wave transmission. Valves not seen well. If clinical suspicion for endocarditis is high consider transesophageal echocardiogram. FINDINGS  Left Ventricle: Left ventricular ejection fraction, by estimation, is 55 to 60%. The left ventricle has normal function. The left ventricle has no regional wall motion abnormalities. The left ventricular internal cavity size was normal in size. There is  no left  ventricular hypertrophy. Left ventricular diastolic parameters were normal. Right Ventricle: The right ventricular size is normal. No increase in right ventricular wall thickness. Right ventricular systolic function is normal. Left Atrium: Left atrial size was normal in size. Right Atrium: Right atrial size was normal in size. Pericardium: There is no evidence of pericardial effusion. Mitral Valve: The mitral valve is normal in structure. No evidence of mitral valve regurgitation. No evidence of mitral valve stenosis. Tricuspid Valve: The tricuspid valve is normal in structure. Tricuspid valve regurgitation is not demonstrated. No evidence of tricuspid stenosis. Aortic Valve: The aortic valve is normal in structure. Aortic valve regurgitation is not visualized. No aortic stenosis is present. Pulmonic Valve: The pulmonic valve was normal in structure. Pulmonic valve regurgitation is not visualized. No evidence of pulmonic stenosis. Aorta: The aortic root is normal in size and structure. Venous: The inferior vena cava is normal in size with greater than 50% respiratory variability, suggesting right atrial pressure of 3 mmHg. IAS/Shunts: No atrial level shunt detected by color flow Doppler.  LEFT VENTRICLE PLAX 2D LVIDd:         5.60 cm   Diastology LVIDs:         3.90 cm   LV e' medial:    9.25 cm/s LV PW:         0.70 cm   LV E/e' medial:  7.4 LV IVS:        0.60 cm   LV e' lateral:   13.30 cm/s LVOT diam:     1.90 cm   LV E/e' lateral: 5.1 LV SV:         50 LV SV Index:   22 LVOT Area:     2.84 cm  RIGHT VENTRICLE RV S prime:     14.80 cm/s TAPSE (M-mode): 2.1 cm LEFT ATRIUM             Index        RIGHT ATRIUM           Index LA diam:        3.20 cm 1.42 cm/m   RA Area:     11.70 cm LA Vol (A2C):   42.6 ml 18.91 ml/m  RA Volume:   26.20 ml  11.63  ml/m LA Vol (A4C):   41.8 ml 18.56 ml/m LA Biplane Vol: 45.2 ml 20.06 ml/m  AORTIC VALVE LVOT Vmax:   90.20 cm/s LVOT Vmean:  68.300 cm/s LVOT VTI:    0.178 m   AORTA Ao Root diam: 3.20 cm Ao Asc diam:  2.80 cm MITRAL VALVE MV Area (PHT): 2.33 cm    SHUNTS MV Decel Time: 325 msec    Systemic VTI:  0.18 m MV E velocity: 68.10 cm/s  Systemic Diam: 1.90 cm MV A velocity: 59.70 cm/s MV E/A ratio:  1.14 Glori Bickers MD Electronically signed by Glori Bickers MD Signature Date/Time: 07/04/2022/1:44:18 PM    Final    DG Chest Port 1 View  Result Date: 07/04/2022 CLINICAL DATA:  Chest tube in place EXAM: PORTABLE CHEST 1 VIEW COMPARISON:  CXR 07/03/22 FINDINGS: Left-sided pleural pigtail drainage catheter in place unchanged in positioning. There is a persistent left-sided pleural effusion there is a small amount pleural air, which is decreased from prior exam. Cardiac and mediastinal contours are unchanged. Persistent left basilar airspace opacity. Known cavitary opacity at the left upper lobe is not definitively visualized on this exam. No radiographically apparent displaced rib fracture. Chronic right clavicular fracture. Right lung is normal in appearance. Visualized upper abdomen is unremarkable. IMPRESSION: 1. Left-sided hydropneumothorax with pleural pigtail drainage catheter in place. Fluid component is unchanged from prior exam. Air component has decreased form prior exam. 2. Persistent left basilar airspace opacity. Electronically Signed   By: Marin Roberts M.D.   On: 07/04/2022 08:19   DG Chest Port 1 View  Result Date: 07/03/2022 CLINICAL DATA:  Chest tube. EXAM: PORTABLE CHEST 1 VIEW COMPARISON:  07/01/2022. FINDINGS: New left-sided chest tube. Pigtail curl is superimposed over the left hilar structures. Decreased left pleural fluid. Persistent opacity, both linear and patchy airspace, noted extending from the left mid lung to the lung base. Small pneumothorax noted at the lateral, inferior left hemithorax. Right lung remains clear. No right pleural effusion or pneumothorax. IMPRESSION: 1. Status post left sided chest tube placement. Significant decrease in  the loculated left pleural effusion. Residual left mid to lower lung opacity is consistent with atelectasis and/or pneumonia and residual fluid. 2. Small pneumothorax noted, left lateral lower hemithorax. Electronically Signed   By: Lajean Manes M.D.   On: 07/03/2022 10:00   (Echo, Carotid, EGD, Colonoscopy, ERCP)    Subjective:   Discharge Exam: Vitals:   07/09/22 0859 07/09/22 1057  BP:  123/84  Pulse: 80   Resp: 19 19  Temp:  98.3 F (36.8 C)  SpO2: 98%    Vitals:   07/09/22 0306 07/09/22 0718 07/09/22 0859 07/09/22 1057  BP: 109/67 110/77  123/84  Pulse: 92  80   Resp: '19  19 19  '$ Temp: 97.8 F (36.6 C) 97.6 F (36.4 C)  98.3 F (36.8 C)  TempSrc: Oral Oral  Oral  SpO2: 98%  98%   Weight: 93.9 kg     Height:          Awake Alert, Oriented X 3, No new F.N deficits, Normal affect, chronic right upper extremity weakness Symmetrical Chest wall movement, improved air entry in the left lung, with some Rales RRR,No Gallops,Rubs or new Murmurs, No Parasternal Heave +ve B.Sounds, Abd Soft, No tenderness, No rebound - guarding or rigidity. No Cyanosis, Clubbing or edema, No new Rash or bruise    The results of significant diagnostics from this hospitalization (including imaging, microbiology, ancillary and laboratory) are listed below  for reference.     Microbiology: Recent Results (from the past 240 hour(s))  MRSA Next Gen by PCR, Nasal     Status: None   Collection Time: 07/02/22  8:10 PM   Specimen: Nasal Mucosa; Nasal Swab  Result Value Ref Range Status   MRSA by PCR Next Gen NOT DETECTED NOT DETECTED Final    Comment: (NOTE) The GeneXpert MRSA Assay (FDA approved for NASAL specimens only), is one component of a comprehensive MRSA colonization surveillance program. It is not intended to diagnose MRSA infection nor to guide or monitor treatment for MRSA infections. Test performance is not FDA approved in patients less than 69 years old. Performed at [facility], [place] [address] [place], [place] [address]   Body fluid culture w Gram Stain     Status: None (Preliminary result)   Collection Time: 07/03/22  9:05 AM   Specimen: Pleural Fluid  Result Value Ref Range Status   Specimen Description PLEURAL  Final   Special Requests LEFT  Final   Gram Stain   Final    ABUNDANT WBC PRESENT, PREDOMINANTLY PMN FEW GRAM POSITIVE COCCI Gram Stain Report Called to,Read Back By and Verified With:  Margaretmary Dys, RN 07/03/22 2045 A. LAFRANCE    Culture   Final    RARE STREPTOCOCCUS INTERMEDIUS SENT TO [facility] FOR SUSCEPTIBILITY TESTING. Performed at [facility], [place] [address] [place], [place] [address]    Report Status PENDING  Incomplete  Susceptibility, Aer + Anaerob     Status: Abnormal   Collection Time: 07/03/22  9:05 AM  Result Value Ref Range Status   Suscept, Aer + Anaerob Preliminary report (A)  Final    Comment: (NOTE) Performed At: [facility] [address] [place], [place] [id number] Rush Farmer MD [id number]    Source of Sample PLEURAL FLUID ISOLATE STREP INTERMEDIUS  Final    Comment: Performed at [facility], [place] [address], [place], [place] [address]  Susceptibility Result     Status: Abnormal   Collection Time: 07/03/22  9:05 AM  Result Value Ref Range Status   Suscept Result 1 Comment (A)  Final    Comment: (NOTE) Streptococcus intermedius Identification performed by account, not confirmed by this laboratory. Performed At: [facility] [place], [place] [id number] Rush Farmer MD [id number]      Labs: BNP (last 3 results) No results for input(s): "BNP" in the last 8760 hours. Basic Metabolic Panel: Recent Labs  Lab 07/02/22 2119 07/03/22 0030 07/07/22 0029 07/08/22 0035  NA 129* 131* 135 136  K 3.5 3.6 4.1 4.4  CL 99 101 101 101  CO2 21* 20* 27 25  GLUCOSE 99 104* 101* 108*  BUN 10 10 <5* 5*  CREATININE 0.98 0.99 0.90 1.05  CALCIUM 7.8* 7.9* 8.5*  9.3   Liver Function Tests: Recent Labs  Lab 07/02/22 2119 07/03/22 0030  AST 13* 12*  ALT 12 13  ALKPHOS 119 101  BILITOT 0.2* 0.8  PROT 6.7 6.5  ALBUMIN 1.9* 1.8*   No results for input(s): "LIPASE", "AMYLASE" in the last 168 hours. No results for input(s): "AMMONIA" in the last 168 hours. CBC: Recent Labs  Lab 07/02/22 2119 07/03/22 0030 07/04/22 0028 07/07/22 0029 07/08/22 0035 07/09/22 0036  WBC 21.2* 20.8* 11.4* 11.8* 11.0* 10.6*  NEUTROABS 16.7* 16.0* 7.3  --   --   --   HGB 7.9* 7.5* 8.1* 9.6* 11.0* 10.9*  HCT 26.1* 24.2*  26.5* 32.0* 38.5* 36.2*  MCV 78.4* 77.1* 78.6* 79.0* 81.7 78.9*  PLT 724* 651* 674* 1,055* 1,145* 1,220*   Cardiac Enzymes: No results for input(s): "CKTOTAL", "CKMB", "CKMBINDEX", "TROPONINI" in the last 168 hours. BNP: Invalid input(s): "POCBNP" CBG: No results for input(s): "GLUCAP" in the last 168 hours. D-Dimer No results for input(s): "DDIMER" in the last 72 hours. Hgb A1c No results for input(s): "HGBA1C" in the last 72 hours. Lipid Profile No results for input(s): "CHOL", "HDL", "LDLCALC", "TRIG", "CHOLHDL", "LDLDIRECT" in the last 72 hours. Thyroid function studies No results for input(s): "TSH", "T4TOTAL", "T3FREE", "THYROIDAB" in the last 72 hours.  Invalid input(s): "FREET3" Anemia work up No results for input(s): "VITAMINB12", "FOLATE", "FERRITIN", "TIBC", "IRON", "RETICCTPCT" in the last 72 hours. Urinalysis No results found for: "COLORURINE", "APPEARANCEUR", "LABSPEC", "PHURINE", "GLUCOSEU", "HGBUR", "BILIRUBINUR", "KETONESUR", "PROTEINUR", "UROBILINOGEN", "NITRITE", "LEUKOCYTESUR" Sepsis Labs Recent Labs  Lab 07/04/22 0028 07/07/22 0029 07/08/22 0035 07/09/22 0036  WBC 11.4* 11.8* 11.0* 10.6*   Microbiology Recent Results (from the past 240 hour(s))  MRSA Next Gen by PCR, Nasal     Status: None   Collection Time: 07/02/22  8:10 PM   Specimen: Nasal Mucosa; Nasal Swab  Result Value Ref Range Status   MRSA by  PCR Next Gen NOT DETECTED NOT DETECTED Final    Comment: (NOTE) The GeneXpert MRSA Assay (FDA approved for NASAL specimens only), is one component of a comprehensive MRSA colonization surveillance program. It is not intended to diagnose MRSA infection nor to guide or monitor treatment for MRSA infections. Test performance is not FDA approved in patients less than 34 years old. Performed at [facility], [place] [address] [place], [place] [address]   Body fluid culture w Gram Stain     Status: None (Preliminary result)   Collection Time: 07/03/22  9:05 AM   Specimen: Pleural Fluid  Result Value Ref Range Status   Specimen Description PLEURAL  Final   Special Requests LEFT  Final   Gram Stain   Final    ABUNDANT WBC PRESENT, PREDOMINANTLY PMN FEW GRAM POSITIVE COCCI Gram Stain Report Called to,Read Back By and Verified With:  Margaretmary Dys, RN 07/03/22 2045 A. LAFRANCE    Culture   Final    RARE STREPTOCOCCUS INTERMEDIUS SENT TO [facility] FOR SUSCEPTIBILITY TESTING. Performed at [facility], [place] [address], [place], [place] [address]    Report Status PENDING  Incomplete  Susceptibility, Aer + Anaerob     Status: Abnormal   Collection Time: 07/03/22  9:05 AM  Result Value Ref Range Status   Suscept, Aer + Anaerob Preliminary report (A)  Final    Comment: (NOTE) Performed At: [facility] [address] [place], [place] [id number] Rush Farmer MD [id number]    Source of Sample PLEURAL FLUID ISOLATE STREP INTERMEDIUS  Final    Comment: Performed at [facility], [place] [address], [place], [place] [address]  Susceptibility Result     Status: Abnormal   Collection Time: 07/03/22  9:05 AM  Result Value Ref Range Status   Suscept Result 1 Comment (A)  Final    Comment: (NOTE) Streptococcus intermedius Identification performed by account, not confirmed by this laboratory. Performed At: [facility] [place], [place], [place]  [id number] Rush Farmer MD [id number]      Time coordinating discharge: Over 30 minutes  SIGNED:   Phillips Climes, MD  [facility] 07/09/2022, 11:36 AM Pager   If 7PM-7AM, please contact  night-coverage [email or web address]

## 2022-07-11 ENCOUNTER — Other Ambulatory Visit (HOSPITAL_COMMUNITY): Payer: Self-pay

## 2022-07-11 LAB — PATHOLOGIST SMEAR REVIEW

## 2022-07-13 LAB — SUSCEPTIBILITY, AER + ANAEROB

## 2022-07-13 LAB — SUSCEPTIBILITY RESULT

## 2022-07-14 DIAGNOSIS — Z79899 Other long term (current) drug therapy: Secondary | ICD-10-CM | POA: Diagnosis not present

## 2022-07-14 DIAGNOSIS — R21 Rash and other nonspecific skin eruption: Secondary | ICD-10-CM | POA: Diagnosis not present

## 2022-07-14 DIAGNOSIS — F17210 Nicotine dependence, cigarettes, uncomplicated: Secondary | ICD-10-CM | POA: Diagnosis not present

## 2022-07-14 DIAGNOSIS — J869 Pyothorax without fistula: Secondary | ICD-10-CM | POA: Diagnosis not present

## 2022-07-14 DIAGNOSIS — D72829 Elevated white blood cell count, unspecified: Secondary | ICD-10-CM | POA: Diagnosis not present

## 2022-07-17 LAB — BODY FLUID CULTURE W GRAM STAIN

## 2022-07-27 NOTE — Progress Notes (Unsigned)
Synopsis: Referred for empyema by [name], MD  Subjective:   PATIENT ID: Ruthine Dose GENDER: male DOB: Nov 21, 1985, MRN: [id number]  No chief complaint on file.  37yM with history of asthma, cerebral thrombosis, spastic hemiplegia, stroke, TBI seen in hospital for L strep empyema, LUL cavitary lesion   Otherwise pertinent review of systems is negative.  Past Medical History:  Diagnosis Date   Asthma    Cerebral thrombosis with cerebral infarction (HCC)    Headache(784.0)    Spastic hemiplegia affecting dominant side ([place])    Stroke (HCC)    TBI (traumatic brain injury) ([place])      Family History  Problem Relation Age of Onset   Arthritis Mother    Anxiety disorder Mother    Diabetes Father      Past Surgical History:  Procedure Laterality Date   ANKLE SURGERY     EYE SURGERY     TONSILECTOMY, ADENOIDECTOMY, BILATERAL MYRINGOTOMY AND TUBES      Social History   Socioeconomic History   Marital status: Single    Spouse name: Not on file   Number of children: Not on file   Years of education: Not on file   Highest education level: Not on file  Occupational History   Not on file  Tobacco Use   Smoking status: Every Day    Types: Cigarettes   Smokeless tobacco: Current  Substance and Sexual Activity   Alcohol use: Not Currently   Drug use: Yes    Types: Marijuana   Sexual activity: Not on file  Other Topics Concern   Not on file  Social History Narrative   Not on file   Social Determinants of Health   Financial Resource Strain: Not on file  Food Insecurity: Not on file  Transportation Needs: Not on file  Physical Activity: Not on file  Stress: Not on file  Social Connections: Not on file  Intimate Partner Violence: Not on file     Allergies  Allergen Reactions   Amoxicillin Rash and Other (See Comments)     Outpatient Medications Prior to Visit  Medication Sig Dispense Refill   albuterol (PROVENTIL) (2.5 MG/3ML) 0.083% nebulizer  solution Take 2.5 mg by nebulization every 4 (four) hours as needed for shortness of breath.     albuterol (VENTOLIN HFA) 108 (90 Base) MCG/ACT inhaler Inhale 2 puffs into the lungs every 4 (four) hours as needed for shortness of breath.     amitriptyline (ELAVIL) 50 MG tablet Take 50 mg by mouth at bedtime.     aspirin 325 MG tablet Take 325 mg by mouth daily.     baclofen (LIORESAL) 20 MG tablet TAKE 1 TABLET FOUR TIMES DAILY. (Patient taking differently: Take 20 mg by mouth 3 (three) times daily.) 120 tablet 2   cefadroxil (DURICEF) 500 MG capsule Take 1 capsule (500 mg total) by mouth 2 (two) times daily for 21 days. 42 capsule 0   DULoxetine (CYMBALTA) 60 MG capsule Take 60 mg by mouth 2 (two) times daily.     ergocalciferol (VITAMIN D2) 50000 UNITS capsule Take 50,000 Units by mouth once a week.     fluticasone (FLOVENT HFA) 110 MCG/ACT inhaler Inhale 2 puffs into the lungs 2 (two) times daily.     gabapentin (NEURONTIN) 800 MG tablet Take 800 mg by mouth 3 (three) times daily.     levocetirizine (XYZAL) 5 MG tablet Take 5 mg by mouth daily.     LINZESS 290 MCG CAPS  capsule Take 290 mcg by mouth daily.     metroNIDAZOLE (FLAGYL) 500 MG tablet Take 1 tablet (500 mg total) by mouth 2 (two) times daily for 21 days. 42 tablet 0   omeprazole (PRILOSEC) 20 MG capsule Take 20 mg by mouth daily as needed (For heartburn).     oxyCODONE (OXY IR/ROXICODONE) 5 MG immediate release tablet Take 1 tablet (5 mg total) by mouth every 6 (six) hours as needed for severe pain. 10 tablet 0   pravastatin (PRAVACHOL) 40 MG tablet TAKE 1 TABLET AT BEDTIME. (Patient taking differently: Take 40 mg by mouth every evening.) 30 tablet 2   Tamsulosin HCl (FLOMAX) 0.4 MG CAPS Take 1 capsule (0.4 mg total) by mouth daily after supper. 30 capsule 4   No facility-administered medications prior to visit.       Objective:   Physical Exam:  General appearance: 37 y.o., male, NAD, conversant  Eyes: anicteric sclerae;  PERRL, tracking appropriately HENT: NCAT; MMM Neck: Trachea midline; no lymphadenopathy, no JVD Lungs: CTAB, no crackles, no wheeze, with normal respiratory effort CV: RRR, no murmur  Abdomen: Soft, non-tender; non-distended, BS present  Extremities: No peripheral edema, warm Skin: Normal turgor and texture; no rash Psych: Appropriate affect Neuro: Alert and oriented to person and place, no focal deficit     There were no vitals filed for this visit.   on *** LPM *** RA BMI Readings from Last 3 Encounters:  07/09/22 25.87 kg/m  01/20/12 25.60 kg/m  09/30/11 25.55 kg/m   Wt Readings from Last 3 Encounters:  07/09/22 207 lb 0.2 oz (93.9 kg)  01/20/12 194 lb (88 kg)  09/30/11 199 lb (90.3 kg)     CBC    Component Value Date/Time   WBC 10.6 (H) 07/09/2022 0036   RBC 4.59 07/09/2022 0036   HGB 10.9 (L) 07/09/2022 0036   HCT 36.2 (L) 07/09/2022 0036   PLT 1,220 (HH) 07/09/2022 0036   MCV 78.9 (L) 07/09/2022 0036   MCH 23.7 (L) 07/09/2022 0036   MCHC 30.1 07/09/2022 0036   RDW 18.9 (H) 07/09/2022 0036   LYMPHSABS 2.5 07/04/2022 0028   MONOABS 1.0 07/04/2022 0028   EOSABS 0.3 07/04/2022 0028   BASOSABS 0.0 07/04/2022 0028    ***  Chest Imaging: CT Chest 07/07/22 with LUL cavitary lesion, volume loss, residual pyopneumothorax  Pulmonary Functions Testing Results:     No data to display          FeNO: ***  Pathology: ***  Echocardiogram: ***  Heart Catheterization: ***    Assessment & Plan:    Plan:      Maryjane Hurter, MD [facility] 07/27/2022 7:10 PM

## 2022-07-28 ENCOUNTER — Encounter: Payer: Self-pay | Admitting: Student

## 2022-07-28 ENCOUNTER — Ambulatory Visit (INDEPENDENT_AMBULATORY_CARE_PROVIDER_SITE_OTHER): Payer: 59

## 2022-07-28 ENCOUNTER — Ambulatory Visit (INDEPENDENT_AMBULATORY_CARE_PROVIDER_SITE_OTHER): Payer: 59 | Admitting: Student

## 2022-07-28 VITALS — BP 108/78 | HR 112 | Temp 98.2°F | Ht 73.0 in | Wt 219.4 lb

## 2022-07-28 DIAGNOSIS — R0609 Other forms of dyspnea: Secondary | ICD-10-CM

## 2022-07-28 DIAGNOSIS — J869 Pyothorax without fistula: Secondary | ICD-10-CM | POA: Diagnosis not present

## 2022-07-28 DIAGNOSIS — J984 Other disorders of lung: Secondary | ICD-10-CM | POA: Diagnosis not present

## 2022-07-28 DIAGNOSIS — J90 Pleural effusion, not elsewhere classified: Secondary | ICD-10-CM | POA: Diagnosis not present

## 2022-07-28 NOTE — Patient Instructions (Signed)
-   take steroid inhaler whether flovent or alternative as directed daily - need to rinse mouth and brush teeth/tongue after using it - albuterol nebulizer or inhaler 1-2 puffs as needed up to 4 times daily - this is a rescue inhaler - recommend avoiding THC vapes, stopping smoking - finish antibiotic course - CT chest in a couple months, you'll be called to schedule it  - PFTs (breathng tests) in 3 months on same day as next clinic appointment

## 2022-07-29 DIAGNOSIS — D649 Anemia, unspecified: Secondary | ICD-10-CM | POA: Diagnosis not present

## 2022-08-09 DIAGNOSIS — J869 Pyothorax without fistula: Secondary | ICD-10-CM | POA: Diagnosis not present

## 2022-08-09 DIAGNOSIS — J4540 Moderate persistent asthma, uncomplicated: Secondary | ICD-10-CM | POA: Diagnosis not present

## 2022-09-02 DIAGNOSIS — D649 Anemia, unspecified: Secondary | ICD-10-CM | POA: Diagnosis not present

## 2022-09-09 DIAGNOSIS — J869 Pyothorax without fistula: Secondary | ICD-10-CM | POA: Diagnosis not present

## 2022-09-09 DIAGNOSIS — J4540 Moderate persistent asthma, uncomplicated: Secondary | ICD-10-CM | POA: Diagnosis not present

## 2022-09-28 ENCOUNTER — Ambulatory Visit (HOSPITAL_COMMUNITY)
Admission: RE | Admit: 2022-09-28 | Discharge: 2022-09-28 | Disposition: A | Discharge status: Home / Self Care | Payer: 59 | Source: Ambulatory Visit | Attending: Student | Admitting: Student

## 2022-09-28 DIAGNOSIS — J984 Other disorders of lung: Secondary | ICD-10-CM | POA: Diagnosis not present

## 2022-09-28 DIAGNOSIS — R918 Other nonspecific abnormal finding of lung field: Secondary | ICD-10-CM | POA: Diagnosis not present

## 2022-10-09 DIAGNOSIS — J4540 Moderate persistent asthma, uncomplicated: Secondary | ICD-10-CM | POA: Diagnosis not present

## 2022-10-09 DIAGNOSIS — J869 Pyothorax without fistula: Secondary | ICD-10-CM | POA: Diagnosis not present

## 2022-11-09 DIAGNOSIS — J869 Pyothorax without fistula: Secondary | ICD-10-CM | POA: Diagnosis not present

## 2022-11-09 DIAGNOSIS — J4540 Moderate persistent asthma, uncomplicated: Secondary | ICD-10-CM | POA: Diagnosis not present

## 2022-12-09 DIAGNOSIS — M549 Dorsalgia, unspecified: Secondary | ICD-10-CM | POA: Diagnosis not present

## 2022-12-09 DIAGNOSIS — E559 Vitamin D deficiency, unspecified: Secondary | ICD-10-CM | POA: Diagnosis not present

## 2022-12-09 DIAGNOSIS — K5909 Other constipation: Secondary | ICD-10-CM | POA: Diagnosis not present

## 2022-12-09 DIAGNOSIS — J869 Pyothorax without fistula: Secondary | ICD-10-CM | POA: Diagnosis not present

## 2022-12-09 DIAGNOSIS — E782 Mixed hyperlipidemia: Secondary | ICD-10-CM | POA: Diagnosis not present

## 2022-12-09 DIAGNOSIS — J4540 Moderate persistent asthma, uncomplicated: Secondary | ICD-10-CM | POA: Diagnosis not present

## 2022-12-09 DIAGNOSIS — I69351 Hemiplegia and hemiparesis following cerebral infarction affecting right dominant side: Secondary | ICD-10-CM | POA: Diagnosis not present

## 2022-12-09 DIAGNOSIS — K219 Gastro-esophageal reflux disease without esophagitis: Secondary | ICD-10-CM | POA: Diagnosis not present

## 2023-01-09 DIAGNOSIS — J869 Pyothorax without fistula: Secondary | ICD-10-CM | POA: Diagnosis not present

## 2023-01-09 DIAGNOSIS — J4540 Moderate persistent asthma, uncomplicated: Secondary | ICD-10-CM | POA: Diagnosis not present

## 2023-02-09 DIAGNOSIS — J4540 Moderate persistent asthma, uncomplicated: Secondary | ICD-10-CM | POA: Diagnosis not present

## 2023-02-09 DIAGNOSIS — J869 Pyothorax without fistula: Secondary | ICD-10-CM | POA: Diagnosis not present

## 2023-02-20 ENCOUNTER — Encounter: Payer: Self-pay | Admitting: Primary Care

## 2023-03-01 ENCOUNTER — Ambulatory Visit: Payer: 59 | Admitting: Primary Care

## 2023-03-11 DIAGNOSIS — J4540 Moderate persistent asthma, uncomplicated: Secondary | ICD-10-CM | POA: Diagnosis not present

## 2023-03-11 DIAGNOSIS — J869 Pyothorax without fistula: Secondary | ICD-10-CM | POA: Diagnosis not present

## 2023-04-11 DIAGNOSIS — J4540 Moderate persistent asthma, uncomplicated: Secondary | ICD-10-CM | POA: Diagnosis not present

## 2023-04-11 DIAGNOSIS — J869 Pyothorax without fistula: Secondary | ICD-10-CM | POA: Diagnosis not present

## 2023-04-17 ENCOUNTER — Ambulatory Visit: Payer: 59 | Admitting: Primary Care

## 2023-05-11 DIAGNOSIS — J869 Pyothorax without fistula: Secondary | ICD-10-CM | POA: Diagnosis not present

## 2023-05-11 DIAGNOSIS — J4540 Moderate persistent asthma, uncomplicated: Secondary | ICD-10-CM | POA: Diagnosis not present

## 2023-07-03 DIAGNOSIS — I69351 Hemiplegia and hemiparesis following cerebral infarction affecting right dominant side: Secondary | ICD-10-CM | POA: Diagnosis not present

## 2023-07-03 DIAGNOSIS — E782 Mixed hyperlipidemia: Secondary | ICD-10-CM | POA: Diagnosis not present

## 2023-07-03 DIAGNOSIS — K5909 Other constipation: Secondary | ICD-10-CM | POA: Diagnosis not present

## 2023-07-03 DIAGNOSIS — K219 Gastro-esophageal reflux disease without esophagitis: Secondary | ICD-10-CM | POA: Diagnosis not present

## 2023-07-03 DIAGNOSIS — E559 Vitamin D deficiency, unspecified: Secondary | ICD-10-CM | POA: Diagnosis not present

## 2024-01-17 DIAGNOSIS — K219 Gastro-esophageal reflux disease without esophagitis: Secondary | ICD-10-CM | POA: Diagnosis not present

## 2024-01-17 DIAGNOSIS — E782 Mixed hyperlipidemia: Secondary | ICD-10-CM | POA: Diagnosis not present

## 2024-01-17 DIAGNOSIS — K5909 Other constipation: Secondary | ICD-10-CM | POA: Diagnosis not present

## 2024-01-17 DIAGNOSIS — I69351 Hemiplegia and hemiparesis following cerebral infarction affecting right dominant side: Secondary | ICD-10-CM | POA: Diagnosis not present
